# Patient Record
Sex: Male | Born: 1985 | Race: White | Hispanic: No | Marital: Single | Smoking: Former smoker
Health system: Southern US, Community
[De-identification: ages and names within clinical notes are randomized; demographics above are authoritative.]

## PROBLEM LIST (undated history)

## (undated) DIAGNOSIS — S27322A Contusion of lung, bilateral, initial encounter: Secondary | ICD-10-CM

## (undated) DIAGNOSIS — F419 Anxiety disorder, unspecified: Secondary | ICD-10-CM

## (undated) DIAGNOSIS — E119 Type 2 diabetes mellitus without complications: Secondary | ICD-10-CM

## (undated) DIAGNOSIS — I10 Essential (primary) hypertension: Secondary | ICD-10-CM

## (undated) DIAGNOSIS — F32A Depression, unspecified: Secondary | ICD-10-CM

## (undated) DIAGNOSIS — F329 Major depressive disorder, single episode, unspecified: Secondary | ICD-10-CM

## (undated) HISTORY — PX: NO PAST SURGERIES: SHX2092

---

## 2004-03-12 ENCOUNTER — Emergency Department: Payer: Self-pay | Admitting: Emergency Medicine

## 2005-05-13 ENCOUNTER — Emergency Department: Payer: Self-pay | Admitting: Emergency Medicine

## 2006-01-30 ENCOUNTER — Emergency Department: Payer: Self-pay | Admitting: Emergency Medicine

## 2006-11-30 ENCOUNTER — Emergency Department: Payer: Self-pay | Admitting: Emergency Medicine

## 2007-01-08 ENCOUNTER — Emergency Department: Payer: Self-pay | Admitting: Emergency Medicine

## 2007-11-02 ENCOUNTER — Emergency Department: Payer: Self-pay | Admitting: Emergency Medicine

## 2008-04-26 ENCOUNTER — Emergency Department: Payer: Self-pay | Admitting: Emergency Medicine

## 2008-09-25 ENCOUNTER — Emergency Department: Payer: Self-pay | Admitting: Internal Medicine

## 2009-06-09 ENCOUNTER — Emergency Department: Payer: Self-pay | Admitting: Emergency Medicine

## 2009-07-25 ENCOUNTER — Emergency Department: Payer: Self-pay | Admitting: Emergency Medicine

## 2010-09-16 ENCOUNTER — Emergency Department: Payer: Self-pay | Admitting: Emergency Medicine

## 2010-12-24 ENCOUNTER — Emergency Department: Payer: Self-pay | Admitting: Emergency Medicine

## 2011-04-30 ENCOUNTER — Emergency Department: Payer: Self-pay | Admitting: Emergency Medicine

## 2011-05-04 ENCOUNTER — Emergency Department: Payer: Self-pay | Admitting: Emergency Medicine

## 2011-06-30 ENCOUNTER — Emergency Department: Payer: Self-pay | Admitting: Emergency Medicine

## 2011-10-27 ENCOUNTER — Emergency Department: Payer: Self-pay | Admitting: Emergency Medicine

## 2012-07-21 ENCOUNTER — Emergency Department: Payer: Self-pay | Admitting: Emergency Medicine

## 2012-07-21 LAB — CBC
HCT: 44.5 % (ref 40.0–52.0)
HGB: 15.5 g/dL (ref 13.0–18.0)
MCH: 29.3 pg (ref 26.0–34.0)
MCHC: 34.8 g/dL (ref 32.0–36.0)
MCV: 84 fL (ref 80–100)
Platelet: 196 10*3/uL (ref 150–440)
RBC: 5.29 10*6/uL (ref 4.40–5.90)
WBC: 6.1 10*3/uL (ref 3.8–10.6)

## 2013-03-15 ENCOUNTER — Emergency Department: Payer: Self-pay | Admitting: Emergency Medicine

## 2013-03-17 ENCOUNTER — Emergency Department (HOSPITAL_COMMUNITY)
Admission: EM | Admit: 2013-03-17 | Discharge: 2013-03-17 | Disposition: A | Discharge status: Home / Self Care | Payer: Self-pay | Attending: Emergency Medicine | Admitting: Emergency Medicine

## 2013-03-17 ENCOUNTER — Encounter (HOSPITAL_COMMUNITY): Payer: Self-pay | Admitting: Emergency Medicine

## 2013-03-17 DIAGNOSIS — B029 Zoster without complications: Secondary | ICD-10-CM | POA: Insufficient documentation

## 2013-03-17 DIAGNOSIS — Z79899 Other long term (current) drug therapy: Secondary | ICD-10-CM | POA: Insufficient documentation

## 2013-03-17 DIAGNOSIS — J069 Acute upper respiratory infection, unspecified: Secondary | ICD-10-CM | POA: Insufficient documentation

## 2013-03-17 MED ORDER — OXYCODONE-ACETAMINOPHEN 5-325 MG PO TABS: 1.0000 | ORAL_TABLET | Freq: Four times a day (QID) | ORAL | Status: DC | PRN

## 2013-03-17 MED ORDER — ACYCLOVIR 200 MG PO CAPS: 800.0000 mg | ORAL_CAPSULE | Freq: Every day | ORAL | Status: DC

## 2013-03-17 MED ORDER — OXYCODONE-ACETAMINOPHEN 5-325 MG PO TABS
1.0000 | ORAL_TABLET | Freq: Once | ORAL | Status: AC
  Administered 2013-03-17: 1 via ORAL
  Filled 2013-03-17: qty 1

## 2013-03-17 MED ORDER — ACYCLOVIR 200 MG PO CAPS: 800.0000 mg | ORAL_CAPSULE | Freq: Once | ORAL | Status: DC

## 2013-03-17 MED ORDER — ONDANSETRON HCL 4 MG PO TABS
4.0000 mg | ORAL_TABLET | Freq: Once | ORAL | Status: AC
  Administered 2013-03-17: 4 mg via ORAL
  Filled 2013-03-17: qty 1

## 2013-03-17 MED ORDER — ACYCLOVIR 800 MG PO TABS
800.0000 mg | ORAL_TABLET | Freq: Once | ORAL | Status: AC
  Administered 2013-03-17: 800 mg via ORAL

## 2013-03-17 MED ORDER — ACYCLOVIR 800 MG PO TABS
ORAL_TABLET | ORAL | Status: AC
  Filled 2013-03-17: qty 1

## 2013-03-17 NOTE — ED Provider Notes (Signed)
CSN: [id number]     Arrival date & time 03/17/13  2013 History   First MD Initiated Contact with Patient 03/17/13 2036     Chief Complaint  Patient presents with  . Herpes Zoster   (Consider location/radiation/quality/duration/timing/severity/associated sxs/prior Treatment) HPI Comments: The patient presents to the emergency department with complaint of painful rash on the right upper shoulder and chest. Patient states that approximately a week ago he began having cold-type symptoms with chills and aches, body aches fever and nasal congestion. The patient states that he was seen at the [facility] emergency department and was diagnosed with" poison oak". The patient continued to have pain even with his close to the rash and generally not feeling well and presents now to the emergency department for evaluation in another opinion. The patient has no rash in any other areas. He has not measured any temperature elevation. He's not had any rash in his hands or feet. It is of note that he is around four  other children at home.  The history is provided by the patient.    History reviewed. No pertinent past medical history. History reviewed. No pertinent past surgical history. History reviewed. No pertinent family history. History  Substance Use Topics  . Smoking status: Never Smoker   . Smokeless tobacco: Not on file  . Alcohol Use: No    Review of Systems  Constitutional: Negative for activity change.       All ROS Neg except as noted in HPI  HENT: Negative for nosebleeds.   Eyes: Negative for photophobia and discharge.  Respiratory: Negative for cough, shortness of breath and wheezing.   Cardiovascular: Negative for chest pain and palpitations.  Gastrointestinal: Negative for abdominal pain and blood in stool.  Genitourinary: Negative for dysuria, frequency and hematuria.  Musculoskeletal: Negative for arthralgias, back pain and neck pain.  Skin: Positive for rash.  Neurological: Negative  for dizziness, seizures and speech difficulty.  Psychiatric/Behavioral: Negative for hallucinations and confusion.    Allergies  Review of patient's allergies indicates no known allergies.  Home Medications   Current Outpatient Rx  Name  Route  Sig  Dispense  Refill  . acyclovir (ZOVIRAX) 200 MG capsule   Oral   Take 4 capsules (800 mg total) by mouth 5 (five) times daily.   140 capsule   0   . oxyCODONE-acetaminophen (PERCOCET/ROXICET) 5-325 MG per tablet   Oral   Take 1 tablet by mouth every 6 (six) hours as needed for severe pain.   20 tablet   0    BP 155/88  Pulse 70  Temp(Src) 98.1 F (36.7 C) (Oral)  Resp 18  Ht 5\' 6"  (1.676 m)  Wt 240 lb (108.863 kg)  BMI 38.76 kg/m2  SpO2 98% Physical Exam  Nursing note and vitals reviewed. Constitutional: He is oriented to person, place, and time. He appears well-developed and well-nourished.  Non-toxic appearance.  HENT:  Head: Normocephalic.  Right Ear: Tympanic membrane and external ear normal.  Left Ear: Tympanic membrane and external ear normal.  Eyes: EOM and lids are normal. Pupils are equal, round, and reactive to light.  Neck: Normal range of motion. Neck supple. Carotid bruit is not present.  Cardiovascular: Normal rate, regular rhythm, normal heart sounds, intact distal pulses and normal pulses.   Pulmonary/Chest: Breath sounds normal. No respiratory distress.  Abdominal: Soft. Bowel sounds are normal. There is no tenderness. There is no guarding.  Musculoskeletal: Normal range of motion.  Lymphadenopathy:  Head (right side): No submandibular adenopathy present.       Head (left side): No submandibular adenopathy present.    He has no cervical adenopathy.  Neurological: He is alert and oriented to person, place, and time. He has normal strength. No cranial nerve deficit or sensory deficit.  Skin: Skin is warm and dry.  There are 2-3 patches of raised red tender rash of the right upper shoulder just above  the scapula. There is pain to palpation at the top of the shoulder on the right side. There are 2 patches of red raised tender rash on the anterior upper chest. No drainage. No red streaking noted.  Psychiatric: He has a normal mood and affect. His speech is normal.    ED Course  Procedures (including critical care time) Labs Review Labs Reviewed - No data to display Imaging Review No results found.  EKG Interpretation   None       MDM   1. Herpes zoster   2. URI (upper respiratory infection)    **I have reviewed nursing notes, vital signs, and all appropriate lab and imaging results for this patient.*  Examination tonight is consistent with herpes zoster. Patient is treated with acyclovir 800 mg 5 times daily and Percocet one every 6 hours as needed for pain. Patient is advised to warn those around him of his herpes zoster/shingles. He is also advised to return to the emergency department or see his primary physician if not improving.  Kathie Dike, PA-C 03/17/13 2133

## 2013-03-17 NOTE — ED Notes (Signed)
Rash to rt scapula and rt upper chest, Seen at [facility] yesterday and dx with  Poison oak  .  Face flushed./red.

## 2013-03-19 NOTE — ED Provider Notes (Signed)
Medical screening examination/treatment/procedure(s) were performed by non-physician practitioner and as supervising physician I was immediately available for consultation/collaboration.   Nelia Shi, MD 03/19/13 [phone]

## 2013-06-07 ENCOUNTER — Emergency Department (HOSPITAL_COMMUNITY)
Admission: EM | Admit: 2013-06-07 | Discharge: 2013-06-07 | Disposition: A | Discharge status: Home / Self Care | Payer: Self-pay | Attending: Emergency Medicine | Admitting: Emergency Medicine

## 2013-06-07 ENCOUNTER — Encounter (HOSPITAL_COMMUNITY): Payer: Self-pay | Admitting: Emergency Medicine

## 2013-06-07 DIAGNOSIS — Z79899 Other long term (current) drug therapy: Secondary | ICD-10-CM | POA: Insufficient documentation

## 2013-06-07 DIAGNOSIS — R519 Headache, unspecified: Secondary | ICD-10-CM

## 2013-06-07 DIAGNOSIS — R112 Nausea with vomiting, unspecified: Secondary | ICD-10-CM | POA: Insufficient documentation

## 2013-06-07 DIAGNOSIS — R509 Fever, unspecified: Secondary | ICD-10-CM

## 2013-06-07 DIAGNOSIS — R630 Anorexia: Secondary | ICD-10-CM | POA: Insufficient documentation

## 2013-06-07 DIAGNOSIS — R1110 Vomiting, unspecified: Secondary | ICD-10-CM

## 2013-06-07 DIAGNOSIS — R51 Headache: Secondary | ICD-10-CM | POA: Insufficient documentation

## 2013-06-07 DIAGNOSIS — R197 Diarrhea, unspecified: Secondary | ICD-10-CM

## 2013-06-07 LAB — CBC WITH DIFFERENTIAL/PLATELET
BASOS ABS: 0 10*3/uL (ref 0.0–0.1)
BASOS PCT: 0 % (ref 0–1)
Eosinophils Absolute: 0 10*3/uL (ref 0.0–0.7)
Eosinophils Relative: 0 % (ref 0–5)
HEMATOCRIT: 42.8 % (ref 39.0–52.0)
HEMOGLOBIN: 14.8 g/dL (ref 13.0–17.0)
Lymphocytes Relative: 11 % — ABNORMAL LOW (ref 12–46)
Lymphs Abs: 0.7 10*3/uL (ref 0.7–4.0)
MCH: 29.1 pg (ref 26.0–34.0)
MCHC: 34.6 g/dL (ref 30.0–36.0)
MCV: 84.1 fL (ref 78.0–100.0)
MONOS PCT: 15 % — AB (ref 3–12)
Monocytes Absolute: 0.9 10*3/uL (ref 0.1–1.0)
NEUTROS ABS: 4.3 10*3/uL (ref 1.7–7.7)
Neutrophils Relative %: 74 % (ref 43–77)
Platelets: 142 10*3/uL — ABNORMAL LOW (ref 150–400)
RBC: 5.09 MIL/uL (ref 4.22–5.81)
RDW: 12.5 % (ref 11.5–15.5)
WBC: 5.8 10*3/uL (ref 4.0–10.5)

## 2013-06-07 LAB — URINALYSIS, ROUTINE W REFLEX MICROSCOPIC
BILIRUBIN URINE: NEGATIVE
GLUCOSE, UA: 100 mg/dL — AB
Hgb urine dipstick: NEGATIVE
KETONES UR: NEGATIVE mg/dL
Leukocytes, UA: NEGATIVE
Nitrite: NEGATIVE
PH: 5.5 (ref 5.0–8.0)
Protein, ur: 100 mg/dL — AB
Specific Gravity, Urine: >1.03 — ABNORMAL HIGH (ref 1.005–1.030)
Urobilinogen, UA: 0.2 mg/dL (ref 0.0–1.0)

## 2013-06-07 LAB — URINE MICROSCOPIC-ADD ON

## 2013-06-07 LAB — COMPREHENSIVE METABOLIC PANEL
ALBUMIN: 3.9 g/dL (ref 3.5–5.2)
ALK PHOS: 59 U/L (ref 39–117)
ALT: 36 U/L (ref 0–53)
AST: 28 U/L (ref 0–37)
BILIRUBIN TOTAL: 0.4 mg/dL (ref 0.3–1.2)
BUN: 12 mg/dL (ref 6–23)
CHLORIDE: 95 meq/L — AB (ref 96–112)
CO2: 27 mEq/L (ref 19–32)
Calcium: 9.1 mg/dL (ref 8.4–10.5)
Creatinine, Ser: 1.21 mg/dL (ref 0.50–1.35)
GFR calc Af Amer: >90 mL/min (ref >90)
GFR, EST NON AFRICAN AMERICAN: 80 mL/min — AB (ref >90)
Glucose, Bld: 164 mg/dL — ABNORMAL HIGH (ref 70–99)
POTASSIUM: 4 meq/L (ref 3.7–5.3)
Sodium: 136 mEq/L — ABNORMAL LOW (ref 137–147)
Total Protein: 7.9 g/dL (ref 6.0–8.3)

## 2013-06-07 LAB — LIPASE, BLOOD: Lipase: 26 U/L (ref 11–59)

## 2013-06-07 MED ORDER — DIPHENOXYLATE-ATROPINE 2.5-0.025 MG PO TABS
2.0000 | ORAL_TABLET | Freq: Once | ORAL | Status: AC
  Administered 2013-06-07: 2 via ORAL
  Filled 2013-06-07: qty 2

## 2013-06-07 MED ORDER — DIPHENOXYLATE-ATROPINE 2.5-0.025 MG PO TABS: 2.0000 | ORAL_TABLET | Freq: Four times a day (QID) | ORAL | Status: DC | PRN

## 2013-06-07 MED ORDER — ONDANSETRON HCL 4 MG/2ML IJ SOLN
4.0000 mg | Freq: Once | INTRAMUSCULAR | Status: AC
  Administered 2013-06-07: 4 mg via INTRAVENOUS
  Filled 2013-06-07: qty 2

## 2013-06-07 MED ORDER — KETOROLAC TROMETHAMINE 30 MG/ML IJ SOLN
30.0000 mg | Freq: Once | INTRAMUSCULAR | Status: AC
Start: 2013-06-07 — End: 2013-06-07
  Administered 2013-06-07: 30 mg via INTRAVENOUS
  Filled 2013-06-07: qty 1

## 2013-06-07 MED ORDER — PROMETHAZINE HCL 25 MG PO TABS: 25.0000 mg | ORAL_TABLET | Freq: Four times a day (QID) | ORAL | Status: DC | PRN

## 2013-06-07 MED ORDER — ACETAMINOPHEN 500 MG PO TABS
1000.0000 mg | ORAL_TABLET | Freq: Once | ORAL | Status: DC
  Filled 2013-06-07: qty 2

## 2013-06-07 MED ORDER — SODIUM CHLORIDE 0.9 % IV BOLUS (SEPSIS)
1000.0000 mL | Freq: Once | INTRAVENOUS | Status: AC
  Administered 2013-06-07: 1000 mL via INTRAVENOUS

## 2013-06-07 NOTE — ED Provider Notes (Signed)
CSN: [id number]     Arrival date & time 06/07/13  1931 History   First MD Initiated Contact with Patient 06/07/13 2016     Chief Complaint  Patient presents with  . Fever     (Consider location/radiation/quality/duration/timing/severity/associated sxs/prior Treatment) HPI Comments: Patient presents to the ER for evaluation of fever, chills, headache, nausea, vomiting, diarrhea. Symptoms began yesterday. Patient reports that he has been unable to hold down anything the course of today. No vomiting blood. He has not had any blood in his stools. Patient denies abdominal pain. No neck pain or stiffness.  Patient is a 28 y.o. male presenting with fever.  Fever Associated symptoms: diarrhea, headaches, nausea and vomiting     History reviewed. No pertinent past medical history. History reviewed. No pertinent past surgical history. History reviewed. No pertinent family history. History  Substance Use Topics  . Smoking status: Never Smoker   . Smokeless tobacco: Not on file  . Alcohol Use: No    Review of Systems  Constitutional: Positive for fever.  Gastrointestinal: Positive for nausea, vomiting and diarrhea.  Neurological: Positive for headaches.  All other systems reviewed and are negative.      Allergies  Review of patient's allergies indicates no known allergies.  Home Medications   Current Outpatient Rx  Name  Route  Sig  Dispense  Refill  . acyclovir (ZOVIRAX) 200 MG capsule   Oral   Take 4 capsules (800 mg total) by mouth 5 (five) times daily.   140 capsule   0   . oxyCODONE-acetaminophen (PERCOCET/ROXICET) 5-325 MG per tablet   Oral   Take 1 tablet by mouth every 6 (six) hours as needed for severe pain.   20 tablet   0    BP 117/47  Pulse 97  Temp(Src) 101.3 F (38.5 C) (Oral)  Resp 18  Ht 5\' 5"  (1.651 m)  Wt 240 lb (108.863 kg)  BMI 39.94 kg/m2  SpO2 98% Physical Exam  Constitutional: He is oriented to person, place, and time. He appears  well-developed and well-nourished. No distress.  HENT:  Head: Normocephalic and atraumatic.  Right Ear: Hearing normal.  Left Ear: Hearing normal.  Nose: Nose normal.  Mouth/Throat: Oropharynx is clear and moist and mucous membranes are normal.  Eyes: Conjunctivae and EOM are normal. Pupils are equal, round, and reactive to light.  Neck: Normal range of motion. Neck supple. No rigidity. Normal range of motion present. No Brudzinski's sign and no Kernig's sign noted.  Cardiovascular: Regular rhythm, S1 normal and S2 normal.  Exam reveals no gallop and no friction rub.   No murmur heard. Pulmonary/Chest: Effort normal and breath sounds normal. No respiratory distress. He exhibits no tenderness.  Abdominal: Soft. Normal appearance and bowel sounds are normal. There is no hepatosplenomegaly. There is no tenderness. There is no rebound, no guarding, no tenderness at McBurney's point and negative Murphy's sign. No hernia.  Musculoskeletal: Normal range of motion.  Neurological: He is alert and oriented to person, place, and time. He has normal strength. No cranial nerve deficit or sensory deficit. Coordination normal. GCS eye subscore is 4. GCS verbal subscore is 5. GCS motor subscore is 6.  Skin: Skin is warm, dry and intact. No rash noted. No cyanosis.  Psychiatric: He has a normal mood and affect. His speech is normal and behavior is normal. Thought content normal.    ED Course  Procedures (including critical care time) Labs Review Labs Reviewed  CBC WITH DIFFERENTIAL  COMPREHENSIVE METABOLIC PANEL  LIPASE, BLOOD  URINALYSIS, ROUTINE W REFLEX MICROSCOPIC   Imaging Review No results found.   EKG Interpretation None      MDM   Final diagnoses:  None   Patient presents to the ER for evaluation of flulike symptoms. Patient has been running a fever with nausea, vomiting and diarrhea. He is not experiencing abdominal pain and has a benign, nontender abdominal exam. Patient complains  of headache, which is felt to be secondary to viral syndrome. There is no meningismus, patient does not have any symptoms or signs that would support meningitis. The patient's blood work is reassuringly normal other than slightly elevated glucose at 164. This will need to be rechecked in the future, but is not diagnostic.  Patient was febrile on arrival. This was treated with Tylenol at home. Patient additionally given Toradol for his headache and generalized body aches. She was given IV fluids and Zofran for nausea and vomiting. Administer Lomotil for diarrhea. Patient has had improvement. Will be discharged with continued symptomatic care, followup with primary care.   Gilda Creasehristopher J. Pollina, MD 06/07/13 2120

## 2013-06-07 NOTE — Discharge Instructions (Signed)
Your blood sugar was slightly elevated today. This was likely secondary to vomiting and acute illness, but will need to be rechecked after you are improved to make sure that it is not running consistently high which could mean diabetes. You have been provided a resource list to find a primary care provider.  Diarrhea Diarrhea is frequent loose and watery bowel movements. It can cause you to feel weak and dehydrated. Dehydration can cause you to become tired and thirsty, have a dry mouth, and have decreased urination that often is dark yellow. Diarrhea is a sign of another problem, most often an infection that will not last long. In most cases, diarrhea typically lasts 2 3 days. However, it can last longer if it is a sign of something more serious. It is important to treat your diarrhea as directed by your caregive to lessen or prevent future episodes of diarrhea. CAUSES  Some common causes include:  Gastrointestinal infections caused by viruses, bacteria, or parasites.  Food poisoning or food allergies.  Certain medicines, such as antibiotics, chemotherapy, and laxatives.  Artificial sweeteners and fructose.  Digestive disorders. HOME CARE INSTRUCTIONS  Ensure adequate fluid intake (hydration): have 1 cup (8 oz) of fluid for each diarrhea episode. Avoid fluids that contain simple sugars or sports drinks, fruit juices, whole milk products, and sodas. Your urine should be clear or pale yellow if you are drinking enough fluids. Hydrate with an oral rehydration solution that you can purchase at pharmacies, retail stores, and online. You can prepare an oral rehydration solution at home by mixing the following ingredients together:    tsp table salt.   tsp baking soda.   tsp salt substitute containing potassium chloride.  1  tablespoons sugar.  1 L (34 oz) of water.  Certain foods and beverages may increase the speed at which food moves through the gastrointestinal (GI) tract. These foods  and beverages should be avoided and include:  Caffeinated and alcoholic beverages.  High-fiber foods, such as raw fruits and vegetables, nuts, seeds, and whole grain breads and cereals.  Foods and beverages sweetened with sugar alcohols, such as xylitol, sorbitol, and mannitol.  Some foods may be well tolerated and may help thicken stool including:  Starchy foods, such as rice, toast, pasta, low-sugar cereal, oatmeal, grits, baked potatoes, crackers, and bagels.  Bananas.  Applesauce.  Add probiotic-rich foods to help increase healthy bacteria in the GI tract, such as yogurt and fermented milk products.  Wash your hands well after each diarrhea episode.  Only take over-the-counter or prescription medicines as directed by your caregiver.  Take a warm bath to relieve any burning or pain from frequent diarrhea episodes. SEEK IMMEDIATE MEDICAL CARE IF:   You are unable to keep fluids down.  You have persistent vomiting.  You have blood in your stool, or your stools are black and tarry.  You do not urinate in 6 8 hours, or there is only a small amount of very dark urine.  You have abdominal pain that increases or localizes.  You have weakness, dizziness, confusion, or lightheadedness.  You have a severe headache.  Your diarrhea gets worse or does not get better.  You have a fever or persistent symptoms for more than 2 3 days.  You have a fever and your symptoms suddenly get worse. MAKE SURE YOU:   Understand these instructions.  Will watch your condition.  Will get help right away if you are not doing well or get worse. Document Released: 03/07/2002 Document  Revised: 03/03/2012 Document Reviewed: 11/23/2011 [facility] Patient Information 2014 [place], [place].  Fever, Adult A fever is a higher than normal body temperature. In an adult, an oral temperature around 98.6 F (37 C) is considered normal. A temperature of 100.4 F (38 C) or higher is generally considered a  fever. Mild or moderate fevers generally have no long-term effects and often do not require treatment. Extreme fever (greater than or equal to 106 F or 41.1 C) can cause seizures. The sweating that may occur with repeated or prolonged fever may cause dehydration. Elderly people can develop confusion during a fever. A measured temperature can vary with:  Age.  Time of day.  Method of measurement (mouth, underarm, rectal, or ear). The fever is confirmed by taking a temperature with a thermometer. Temperatures can be taken different ways. Some methods are accurate and some are not.  An oral temperature is used most commonly. Electronic thermometers are fast and accurate.  An ear temperature will only be accurate if the thermometer is positioned as recommended by the manufacturer.  A rectal temperature is accurate and done for those adults who have a condition where an oral temperature cannot be taken.  An underarm (axillary) temperature is not accurate and not recommended. Fever is a symptom, not a disease.  CAUSES   Infections commonly cause fever.  Some noninfectious causes for fever include:  Some arthritis conditions.  Some thyroid or adrenal gland conditions.  Some immune system conditions.  Some types of cancer.  A medicine reaction.  High doses of certain street drugs such as methamphetamine.  Dehydration.  Exposure to high outside or room temperatures.  Occasionally, the source of a fever cannot be determined. This is sometimes called a "fever of unknown origin" (FUO).  Some situations may lead to a temporary rise in body temperature that may go away on its own. Examples are:  Childbirth.  Surgery.  Intense exercise. HOME CARE INSTRUCTIONS   Take appropriate medicines for fever. Follow dosing instructions carefully. If you use acetaminophen to reduce the fever, be careful to avoid taking other medicines that also contain acetaminophen. Do not take aspirin for  a fever if you are younger than age 56. There is an association with Reye's syndrome. Reye's syndrome is a rare but potentially deadly disease.  If an infection is present and antibiotics have been prescribed, take them as directed. Finish them even if you start to feel better.  Rest as needed.  Maintain an adequate fluid intake. To prevent dehydration during an illness with prolonged or recurrent fever, you may need to drink extra fluid.Drink enough fluids to keep your urine clear or pale yellow.  Sponging or bathing with room temperature water may help reduce body temperature. Do not use ice water or alcohol sponge baths.  Dress comfortably, but do not over-bundle. SEEK MEDICAL CARE IF:   You are unable to keep fluids down.  You develop vomiting or diarrhea.  You are not feeling at least partly better after 3 days.  You develop new symptoms or problems. SEEK IMMEDIATE MEDICAL CARE IF:   You have shortness of breath or trouble breathing.  You develop excessive weakness.  You are dizzy or you faint.  You are extremely thirsty or you are making little or no urine.  You develop new pain that was not there before (such as in the head, neck, chest, back, or abdomen).  You have persistant vomiting and diarrhea for more than 1 to 2 days.  You develop a  stiff neck or your eyes become sensitive to light.  You develop a skin rash.  You have a fever or persistent symptoms for more than 2 to 3 days.  You have a fever and your symptoms suddenly get worse. MAKE SURE YOU:   Understand these instructions.  Will watch your condition.  Will get help right away if you are not doing well or get worse. Document Released: 09/10/2000 Document Revised: 06/09/2011 Document Reviewed: 01/16/2011 [facility] Patient Information 2014 [place], [place].  Nausea and Vomiting Nausea is a sick feeling that often comes before throwing up (vomiting). Vomiting is a reflex where stomach contents come out  of your mouth. Vomiting can cause severe loss of body fluids (dehydration). Children and elderly adults can become dehydrated quickly, especially if they also have diarrhea. Nausea and vomiting are symptoms of a condition or disease. It is important to find the cause of your symptoms. CAUSES   Direct irritation of the stomach lining. This irritation can result from increased acid production (gastroesophageal reflux disease), infection, food poisoning, taking certain medicines (such as nonsteroidal anti-inflammatory drugs), alcohol use, or tobacco use.  Signals from the brain.These signals could be caused by a headache, heat exposure, an inner ear disturbance, increased pressure in the brain from injury, infection, a tumor, or a concussion, pain, emotional stimulus, or metabolic problems.  An obstruction in the gastrointestinal tract (bowel obstruction).  Illnesses such as diabetes, hepatitis, gallbladder problems, appendicitis, kidney problems, cancer, sepsis, atypical symptoms of a heart attack, or eating disorders.  Medical treatments such as chemotherapy and radiation.  Receiving medicine that makes you sleep (general anesthetic) during surgery. DIAGNOSIS Your caregiver may ask for tests to be done if the problems do not improve after a few days. Tests may also be done if symptoms are severe or if the reason for the nausea and vomiting is not clear. Tests may include:  Urine tests.  Blood tests.  Stool tests.  Cultures (to look for evidence of infection).  X-rays or other imaging studies. Test results can help your caregiver make decisions about treatment or the need for additional tests. TREATMENT You need to stay well hydrated. Drink frequently but in small amounts.You may wish to drink water, sports drinks, clear broth, or eat frozen ice pops or gelatin dessert to help stay hydrated.When you eat, eating slowly may help prevent nausea.There are also some antinausea medicines that  may help prevent nausea. HOME CARE INSTRUCTIONS   Take all medicine as directed by your caregiver.  If you do not have an appetite, do not force yourself to eat. However, you must continue to drink fluids.  If you have an appetite, eat a normal diet unless your caregiver tells you differently.  Eat a variety of complex carbohydrates (rice, wheat, potatoes, bread), lean meats, yogurt, fruits, and vegetables.  Avoid high-fat foods because they are more difficult to digest.  Drink enough water and fluids to keep your urine clear or pale yellow.  If you are dehydrated, ask your caregiver for specific rehydration instructions. Signs of dehydration may include:  Severe thirst.  Dry lips and mouth.  Dizziness.  Dark urine.  Decreasing urine frequency and amount.  Confusion.  Rapid breathing or pulse. SEEK IMMEDIATE MEDICAL CARE IF:   You have blood or brown flecks (like coffee grounds) in your vomit.  You have black or bloody stools.  You have a severe headache or stiff neck.  You are confused.  You have severe abdominal pain.  You have chest pain  or trouble breathing.  You do not urinate at least once every 8 hours.  You develop cold or clammy skin.  You continue to vomit for longer than 24 to 48 hours.  You have a fever. MAKE SURE YOU:   Understand these instructions.  Will watch your condition.  Will get help right away if you are not doing well or get worse. Document Released: 03/17/2005 Document Revised: 06/09/2011 Document Reviewed: 08/14/2010 [facility] Patient Information 2014 [place], [place].  Viral Infections A viral infection can be caused by different types of viruses.Most viral infections are not serious and resolve on their own. However, some infections may cause severe symptoms and may lead to further complications. SYMPTOMS Viruses can frequently cause:  Minor sore throat.  Aches and pains.  Headaches.  Runny nose.  Different types of  rashes.  Watery eyes.  Tiredness.  Cough.  Loss of appetite.  Gastrointestinal infections, resulting in nausea, vomiting, and diarrhea. These symptoms do not respond to antibiotics because the infection is not caused by bacteria. However, you might catch a bacterial infection following the viral infection. This is sometimes called a "superinfection." Symptoms of such a bacterial infection may include:  Worsening sore throat with pus and difficulty swallowing.  Swollen neck glands.  Chills and a high or persistent fever.  Severe headache.  Tenderness over the sinuses.  Persistent overall ill feeling (malaise), muscle aches, and tiredness (fatigue).  Persistent cough.  Yellow, green, or brown mucus production with coughing. HOME CARE INSTRUCTIONS   Only take over-the-counter or prescription medicines for pain, discomfort, diarrhea, or fever as directed by your caregiver.  Drink enough water and fluids to keep your urine clear or pale yellow. Sports drinks can provide valuable electrolytes, sugars, and hydration.  Get plenty of rest and maintain proper nutrition. Soups and broths with crackers or rice are fine. SEEK IMMEDIATE MEDICAL CARE IF:   You have severe headaches, shortness of breath, chest pain, neck pain, or an unusual rash.  You have uncontrolled vomiting, diarrhea, or you are unable to keep down fluids.  You or your child has an oral temperature above 102 F (38.9 C), not controlled by medicine.  Your baby is older than 3 months with a rectal temperature of 102 F (38.9 C) or higher.  Your baby is 43 months old or younger with a rectal temperature of 100.4 F (38 C) or higher. MAKE SURE YOU:   Understand these instructions.  Will watch your condition.  Will get help right away if you are not doing well or get worse. Document Released: 12/25/2004 Document Revised: 06/09/2011 Document Reviewed: 07/22/2010 [facility] Patient Information 2014 [place],  [place].   Emergency Department Resource Guide 1) Find a Doctor and Pay Out of Pocket Although you won't have to find out who is covered by your insurance plan, it is a good idea to ask around and get recommendations. You will then need to call the office and see if the doctor you have chosen will accept you as a new patient and what types of options they offer for patients who are self-pay. Some doctors offer discounts or will set up payment plans for their patients who do not have insurance, but you will need to ask so you aren't surprised when you get to your appointment.  2) Contact Your Local Health Department Not all health departments have doctors that can see patients for sick visits, but many do, so it is worth a call to see if yours does. If you don't  know where your local health department is, you can check in your phone book. The CDC also has a tool to help you locate your state's health department, and many state websites also have listings of all of their local health departments.  3) Find a Walk-in Clinic If your illness is not likely to be very severe or complicated, you may want to try a walk in clinic. These are popping up all over the country in pharmacies, drugstores, and shopping centers. They're usually staffed by nurse practitioners or physician assistants that have been trained to treat common illnesses and complaints. They're usually fairly quick and inexpensive. However, if you have serious medical issues or chronic medical problems, these are probably not your best option.  No Primary Care Doctor: - Call Health Connect at  [phone] - they can help you locate a primary care doctor that  accepts your insurance, provides certain services, etc. - Physician Referral Service- [phone]  Chronic Pain Problems: Organization         Address  Phone   Notes  [facility]  [phone] Patients need to be referred by their primary care doctor.   Medication  Assistance: Organization         Address  Phone   Notes  [facility] Medication [facility] [address] [place] [place], [place] [address] [phone] --Must be a resident of [facility] -- Must have NO insurance coverage whatsoever (no Medicaid/ Medicare, etc.) -- The pt. MUST have a primary care doctor that directs their care regularly and follows them in the community   [facility]ist  [phone]   [employer]  [phone]    Agencies that provide inexpensive medical care: Organization         Address  Phone   Notes  [facility]  [phone]   [facility]    [phone]   [facility] [address] [place], [place] [address] [phone]   [facility] [address] [place] [phone]   Planned Parenthood    [phone]   [facility]    [phone]   Community Health and [facility]  [address], [place] Phone:  [phone], Fax:  [phone] Hours of Operation:  9 am - 6 pm, M-F.  Also accepts Medicaid/Medicare and self-pay.  [facility]  [address], [place] Phone: [phone], Fax: [phone]. Hours of Operation:  8:30 am - 5:30 pm, M-F.  Also accepts Medicaid and self-pay.  [facility] [place] [address] [place] Phone: [phone]   [facility] [address] Natasha Bence [place], [place] [phone], Ext. [phone] Mondays & Thursdays: 7-9 AM.  First 15 patients are seen on a first come, first serve basis.    Medicaid-accepting [facility] Providers:  Organization         Address  Phone   Notes  [facility] [address], [place] [phone] Also accepts self-pay patients.  [facility] [address] Laurell Josephs [place], [place]  [phone]   [facility] [address], [place]  [phone]   [facility] [address] [place] [phone]   Renaye Rakers [address], [place]   [phone] Only accepts [place] Access  Medicaid patients after they have their name applied to their card.   Self-Pay (no insurance) in [facility]:  Organization         Address  Phone   Notes  Sickle Cell Patients, [facility] [address] [place], [place] [phone]   [facility] [address] [place], [place] [phone]   [facility] [place]  [address], [place] [phone]   [facility]/Dr. Osei-Bonsu  [address], [place] or [address], [place] [phone] Phone number for both [place] and [place] locations is the same.  [facility] [address], [place] [phone]   [facility] [address], [place] or [address] [phone]   [facility] [address] [place] [phone], phone; [phone], fax Sees patients 1st and 3rd Saturday of every month.  Must not qualify for public or private insurance (i.e. Medicaid, Medicare, [place] Health Choice, Veterans' Benefits)  Household income should be no more than 200% of the poverty level The clinic cannot treat you if you are pregnant or think you are pregnant  Sexually transmitted diseases are not treated at the clinic.    Dental Care: Organization         Address  Phone  Notes  [facility] Department of [facility] [address] [place], [place] [phone] Accepts children up to age 86 who are enrolled in [place] or [place] Health Choice; pregnant women with a Medicaid card; and children who have applied for Medicaid or [place] Health Choice, but were declined, whose parents can pay a reduced fee at time of service.  [facility]  Department of [facility]  [address], [place] [phone] Accepts children up to age 88 who are enrolled in [place] or [place] Health Choice; pregnant women with a Medicaid card; and children who have applied for Medicaid or [place] Health Choice, but were declined, whose parents can pay a reduced fee at time of service.  [facility]  [address] [place], [place] [phone] Patients are seen by appointment only. Walk-ins are not accepted. [facility] will see patients 31 years of age and older. Monday - Tuesday (8am-5pm) Most Wednesdays (8:30-5pm) $30 per visit, cash only  [facility] Access [facility]  [address], [facility] [phone] Patients are seen by appointment only. Walk-ins are not accepted. [facility] will see patients 47 years of age and older. One Wednesday Evening (Monthly: Volunteer Based).  $30 per visit, cash only  [employer]  [phone] for adults; Children under age 31, call Graduate Pediatric Dentistry at [phone]. Children aged 55-14, please call [phone] to request a pediatric application.  Dental services are provided in all areas of dental care including fillings, crowns and bridges, complete and partial dentures, implants, gum treatment, root canals, and extractions. Preventive care is also provided. Treatment is provided to both adults and children. Patients are selected via a lottery and there is often a waiting list.   [facility] [address] [place]  [phone] [email or web address]   [facility] [address] [place], [place] [phone], Ext. [phone] Second and Fourth Thursday of each month, opens at 6:30 AM; Clinic ends at 9 AM.  Patients are seen on a first-come first-served basis, and a limited number are seen during each clinic.   [facility]  [address] Ether Griffins [place], [place] [phone]    Eligibility Requirements You must have lived in [place], [place], or [place] counties for at least the last three months.   You cannot be eligible for state or federal sponsored [place], including [employer], [place], or [employer].   You generally cannot be eligible for healthcare insurance through your employer.    How to apply: Eligibility screenings are held every Tuesday and Wednesday afternoon from 1:00 pm until 4:00 pm. You do not need an appointment for the interview!  [facility] [address] [place], [place] [phone]   [facility] Health Department  [phone]   [facility] Health Department  [phone]   [facility] Department  [phone]    Behavioral Health Resources in the Community: Intensive Outpatient Programs Organization         Address  Phone  Notes  [facility] Services [address] [place], [place] [phone]   [facility] Outpatient [address], [place], [place] [phone]   [facility] [address] [place], [place]  [phone]   [facility] Mental Health [address]  [place], [place] [phone] or [phone]   Substance Abuse Resources Organization         Address  Phone  Notes  [facility]  [phone]   [facility]  [phone]   [facility]  [phone]   Floydene Flock  [phone]   Residential & Outpatient Substance Abuse Program  [phone]   Psychological Services Organization         Address  Phone  Notes  [facility]  [phone]   [facility] Services  [phone]   [facility] Mental Health [address], [place] [phone] or [phone]    Mobile Crisis Teams Organization         Address  Phone  Notes  [facility], Mobile Crisis Care Unit  [phone]   [facility]  [address]  [place], [place] [phone]   Doristine Locks [address] [place] [phone]    Self-Help/Support Groups Organization         Address  Phone             Notes  [facility] [place] - variety of support groups  [phone] Call for more information  Narcotics Anonymous (NA), [facility] [address], [employer] [place]  2 meetings at this location   Statistician         Address  Phone  Notes  [facility] [address],    [place]  [phone]   [facility]  [address], [place] [address], [place], [place] [phone]   [facility] [address] [place], [place] [phone] Admissions: 8am-3pm M-F  [facility] [address],    [place], [place] [phone]   [facility] [address] [place] [place], [place] [phone]   [facility] [address],  [place], [place] [phone]   [facility] Int[facility] [address], [place], [place] [phone]   [facility] ([facility] [address]  [place], [place] [phone] or [phone]   [facility] ([facility] [address] [place], [place] [phone] Accepts Medicaid  [facility] [address]  [place] [phone] Substance Abuse/Addiction Treatment   [facility]         Address  Phone  Notes  [facility]  [phone]) [phone]   Angie FavaJulie Brannon, PhD [address], [address] [place], [place]   [phone]) [phone] or [phone]) [phone]   [facility]   [address] [place], [place] [phone]) [phone]   [facility] [address], [place], [place] [phone]) [phone] Insurance/Medicaid/sponsorship through [facility]  [facility] [address]                                    [place], [place] [phone]) [phone] Therapy/tele-psych/case    [facility] [address]   [place], [place] [phone]) [phone]    Dr. Lolly MustacheArfeen  [phone]) [phone]   [facility]  [facility] 1) [address], [place] 2) [address], [place] 3)  [address], [place] [phone]) [phone]) [phone]  [phone]) [phone]   [facility] [phone]) [phone] or [phone]) [phone] (After Hours)

## 2013-06-07 NOTE — ED Notes (Signed)
Pt reporting improvement in nausea and headache.  P.O fluids and crackers provided.

## 2013-06-07 NOTE — ED Notes (Signed)
PT HAS HAD HEADACHE, FEVER, AND N/V/D SINCE YESTERDAY.

## 2013-06-09 ENCOUNTER — Emergency Department (HOSPITAL_COMMUNITY)
Admission: EM | Admit: 2013-06-09 | Discharge: 2013-06-10 | Disposition: A | Discharge status: Home / Self Care | Payer: Self-pay | Attending: Emergency Medicine | Admitting: Emergency Medicine

## 2013-06-09 ENCOUNTER — Encounter (HOSPITAL_COMMUNITY): Payer: Self-pay | Admitting: Emergency Medicine

## 2013-06-09 ENCOUNTER — Emergency Department (HOSPITAL_COMMUNITY): Payer: Self-pay

## 2013-06-09 DIAGNOSIS — R112 Nausea with vomiting, unspecified: Secondary | ICD-10-CM | POA: Insufficient documentation

## 2013-06-09 DIAGNOSIS — J111 Influenza due to unidentified influenza virus with other respiratory manifestations: Secondary | ICD-10-CM | POA: Insufficient documentation

## 2013-06-09 DIAGNOSIS — R42 Dizziness and giddiness: Secondary | ICD-10-CM | POA: Insufficient documentation

## 2013-06-09 DIAGNOSIS — R079 Chest pain, unspecified: Secondary | ICD-10-CM | POA: Insufficient documentation

## 2013-06-09 DIAGNOSIS — R197 Diarrhea, unspecified: Secondary | ICD-10-CM | POA: Insufficient documentation

## 2013-06-09 DIAGNOSIS — K1370 Unspecified lesions of oral mucosa: Secondary | ICD-10-CM | POA: Insufficient documentation

## 2013-06-09 DIAGNOSIS — E860 Dehydration: Secondary | ICD-10-CM | POA: Insufficient documentation

## 2013-06-09 DIAGNOSIS — R6889 Other general symptoms and signs: Secondary | ICD-10-CM

## 2013-06-09 DIAGNOSIS — Z8619 Personal history of other infectious and parasitic diseases: Secondary | ICD-10-CM | POA: Insufficient documentation

## 2013-06-09 MED ORDER — DIPHENHYDRAMINE HCL 50 MG/ML IJ SOLN
25.0000 mg | Freq: Once | INTRAMUSCULAR | Status: AC
  Administered 2013-06-09: 25 mg via INTRAVENOUS
  Filled 2013-06-09: qty 1

## 2013-06-09 MED ORDER — ACETAMINOPHEN 500 MG PO TABS
1000.0000 mg | ORAL_TABLET | Freq: Once | ORAL | Status: AC
  Administered 2013-06-09: 1000 mg via ORAL
  Filled 2013-06-09: qty 2

## 2013-06-09 MED ORDER — SODIUM CHLORIDE 0.9 % IV BOLUS (SEPSIS)
1000.0000 mL | Freq: Once | INTRAVENOUS | Status: AC
  Administered 2013-06-09: 1000 mL via INTRAVENOUS

## 2013-06-09 MED ORDER — ONDANSETRON 4 MG PO TBDP
4.0000 mg | ORAL_TABLET | Freq: Once | ORAL | Status: AC
  Administered 2013-06-09: 4 mg via ORAL
  Filled 2013-06-09: qty 1

## 2013-06-09 MED ORDER — KETOROLAC TROMETHAMINE 30 MG/ML IJ SOLN
30.0000 mg | Freq: Once | INTRAMUSCULAR | Status: AC
  Administered 2013-06-09: 30 mg via INTRAVENOUS
  Filled 2013-06-09: qty 1

## 2013-06-09 MED ORDER — METOCLOPRAMIDE HCL 5 MG/ML IJ SOLN
10.0000 mg | Freq: Once | INTRAMUSCULAR | Status: AC
  Administered 2013-06-09: 10 mg via INTRAVENOUS
  Filled 2013-06-09: qty 2

## 2013-06-09 NOTE — ED Provider Notes (Signed)
CSN: [id number]     Arrival date & time 06/09/13  2116 History  This chart was scribed for Shon Baton, MD by Bennett Scrape, ED Scribe. This patient was seen in room APA09/APA09 and the patient's care was started at 11:08 PM.   Chief Complaint  Patient presents with  . Fever  . Emesis    The history is provided by the patient. No language interpreter was used.    HPI Comments: Deangelo Berns is a 28 y.o. male who presents to the Emergency Department complaining of 4 days of fever with associated HA, dizziness, CP, nausea, emesis, diarrhea and blisters on the sides of mouth. He also reports abdominal pain that has since resolved. Fever max was 103.5. Temperature has not dropped below 100 since the onset. Temperature in the ED is 102.3. He was seen in the ED 3 days ago for the same and was dx with a virus. He has been alternating Tylenol and IBU since then with mild improvement.  He was given phenergan with no real improvement and he admits to decreased fluid and food intake since his last visit. He denies any sick contacts with similar symptoms. He denies any SOB and sore throat. He denies any chromic medical conditions or being on any daily medications.   History reviewed. No pertinent past medical history. History reviewed. No pertinent past surgical history. History reviewed. No pertinent family history. History  Substance Use Topics  . Smoking status: Never Smoker   . Smokeless tobacco: Not on file  . Alcohol Use: No    Review of Systems  Constitutional: Positive for fever, chills and fatigue.  HENT: Positive for mouth sores. Negative for ear discharge.   Respiratory: Negative for chest tightness.   Cardiovascular: Positive for chest pain.  Gastrointestinal: Positive for nausea, vomiting and diarrhea. Negative for abdominal pain.  Genitourinary: Negative.  Negative for dysuria.  Musculoskeletal: Negative for back pain.  Skin: Negative for rash.  Neurological: Positive for  headaches. Negative for dizziness and weakness.  All other systems reviewed and are negative.      Allergies  Review of patient's allergies indicates no known allergies.  Home Medications   Current Outpatient Rx  Name  Route  Sig  Dispense  Refill  . acetaminophen (TYLENOL) 500 MG tablet   Oral   Take 500 mg by mouth every 6 (six) hours as needed for mild pain or moderate pain.         . promethazine (PHENERGAN) 25 MG tablet   Oral   Take 1 tablet (25 mg total) by mouth every 6 (six) hours as needed for nausea or vomiting.   30 tablet   0   . diphenoxylate-atropine (LOMOTIL) 2.5-0.025 MG per tablet   Oral   Take 2 tablets by mouth 4 (four) times daily as needed for diarrhea or loose stools.   30 tablet   0   . ondansetron (ZOFRAN-ODT) 4 MG disintegrating tablet   Oral   Take 1 tablet (4 mg total) by mouth every 8 (eight) hours as needed for nausea or vomiting.   20 tablet   0    Triage Vitals: BP 125/74  Pulse 89  Temp(Src) 102.3 F (39.1 C) (Oral)  Resp 22  Ht 5\' 6"  (1.676 m)  Wt 240 lb (108.863 kg)  BMI 38.76 kg/m2  SpO2 97%  Physical Exam  Nursing note and vitals reviewed. Constitutional: He is oriented to person, place, and time. No distress.  Ill-appearing but nontoxic, no acute  distress  HENT:  Head: Normocephalic and atraumatic.  Right Ear: External ear normal.  Left Ear: External ear normal.  Mucous membranes dry, small ulcers noted over the oral mucosa, no palatal petechiae, no tonsillar exudate, uvula midline  Eyes: Pupils are equal, round, and reactive to light.  Neck: Normal range of motion. Neck supple.  No meningismus  Cardiovascular: Normal rate, regular rhythm and normal heart sounds.   No murmur heard. Pulmonary/Chest: Effort normal and breath sounds normal. No respiratory distress. He has no wheezes.  Abdominal: Soft. Bowel sounds are normal. There is no tenderness. There is no rebound.  Musculoskeletal: He exhibits no edema.   Lymphadenopathy:    He has no cervical adenopathy.  Neurological: He is alert and oriented to person, place, and time.  Skin: Skin is warm and dry. No rash noted.  Psychiatric: He has a normal mood and affect.    ED Course  Procedures (including critical care time)  DIAGNOSTIC STUDIES: Oxygen Saturation is 97% on RA, adequate by my interpretation.    COORDINATION OF CARE: 11:11 PM-Informed pt that suspicions are that the sxs are from a viral infection which could include the flu; however, the pt is out of the window for any treatment. Discussed treatment plan which includes CXR and IV fluids with pt at bedside and pt agreed to plan.    Labs Review Labs Reviewed  CBC WITH DIFFERENTIAL - Abnormal; Notable for the following:    WBC 2.9 (*)    HCT 38.9 (*)    Platelets 138 (*)    Lymphs Abs 0.5 (*)    Monocytes Relative 15 (*)    All other components within normal limits  COMPREHENSIVE METABOLIC PANEL - Abnormal; Notable for the following:    Sodium 135 (*)    Glucose, Bld 198 (*)    Calcium 8.1 (*)    All other components within normal limits  RAPID STREP SCREEN  CULTURE, GROUP A STREP   Imaging Review Dg Chest 2 View  06/10/2013   CLINICAL DATA:  Weakness, emesis, fever.  EXAM: CHEST  2 VIEW  COMPARISON:  None.  FINDINGS: Mild hypoaeration with interstitial and vascular crowding. No confluent airspace opacity, pleural effusion, or pneumothorax. Cardiomediastinal contours are within normal range for inspiratory effort. No acute osseous finding.  IMPRESSION: Mild hypoaeration. No radiographic evidence of an acute cardiopulmonary process.   Electronically Signed   By: Jearld LeschAndrew  DelGaizo M.D.   On: 06/10/2013 00:50     EKG Interpretation None      MDM   Final diagnoses:  Flu-like symptoms  Dehydration   Patient presents with continued flulike symptoms. Temperature noted to be 1-2.3 in triage. Ill-appearing but nontoxic. Repeat labwork was obtained as well as chest x-ray  and strep screen. Lab work notable for leukopenia with a white count of 2.9. Otherwise, labwork stable. Patient was given normal saline, Reglan, and Benadryl given headache and nausea. No evidence of meningismus and suspect headache is again of viral etiology given constellation of symptoms. Patient reports improvement of symptoms with treatment. I discussed the patient that he may have symptoms for the next few days. He needs to continue supportive care. We'll add Zofran as an anti-emetic. Patient was given strict return precautions.  After history, exam, and medical workup I feel the patient has been appropriately medically screened and is safe for discharge home. Pertinent diagnoses were discussed with the patient. Patient was given return precautions.   I personally performed the services described in this documentation,  which was scribed in my presence. The recorded information has been reviewed and is accurate.      Shon Baton, MD 06/10/13 [phone]

## 2013-06-09 NOTE — ED Notes (Signed)
Pt was seen here on Tuesday with N/V/D. Pt was sent home on phenergan and did not have any more episodes of vomiting until this morning. Hasn't been able to keep anything down. States he feels really weak. Wife states pt has been having fevers and has woken up with bed saturated in sweat.

## 2013-06-09 NOTE — ED Notes (Signed)
Patient complaining of emesis, headache, diffuse abdominal pain, blister in mouth, and fever since Monday.

## 2013-06-10 LAB — CBC WITH DIFFERENTIAL/PLATELET
BASOS PCT: 0 % (ref 0–1)
Basophils Absolute: 0 10*3/uL (ref 0.0–0.1)
Eosinophils Absolute: 0 10*3/uL (ref 0.0–0.7)
Eosinophils Relative: 0 % (ref 0–5)
HCT: 38.9 % — ABNORMAL LOW (ref 39.0–52.0)
HEMOGLOBIN: 13.5 g/dL (ref 13.0–17.0)
LYMPHS ABS: 0.5 10*3/uL — AB (ref 0.7–4.0)
Lymphocytes Relative: 16 % (ref 12–46)
MCH: 28.8 pg (ref 26.0–34.0)
MCHC: 34.7 g/dL (ref 30.0–36.0)
MCV: 83.1 fL (ref 78.0–100.0)
MONOS PCT: 15 % — AB (ref 3–12)
Monocytes Absolute: 0.4 10*3/uL (ref 0.1–1.0)
NEUTROS ABS: 1.9 10*3/uL (ref 1.7–7.7)
Neutrophils Relative %: 69 % (ref 43–77)
Platelets: 138 10*3/uL — ABNORMAL LOW (ref 150–400)
RBC: 4.68 MIL/uL (ref 4.22–5.81)
RDW: 12.3 % (ref 11.5–15.5)
WBC: 2.9 10*3/uL — ABNORMAL LOW (ref 4.0–10.5)

## 2013-06-10 LAB — COMPREHENSIVE METABOLIC PANEL
ALBUMIN: 3.5 g/dL (ref 3.5–5.2)
ALK PHOS: 51 U/L (ref 39–117)
ALT: 38 U/L (ref 0–53)
AST: 35 U/L (ref 0–37)
BILIRUBIN TOTAL: 0.3 mg/dL (ref 0.3–1.2)
BUN: 8 mg/dL (ref 6–23)
CO2: 24 mEq/L (ref 19–32)
Calcium: 8.1 mg/dL — ABNORMAL LOW (ref 8.4–10.5)
Chloride: 98 mEq/L (ref 96–112)
Creatinine, Ser: 0.92 mg/dL (ref 0.50–1.35)
GFR calc Af Amer: >90 mL/min (ref >90)
GFR calc non Af Amer: >90 mL/min (ref >90)
Glucose, Bld: 198 mg/dL — ABNORMAL HIGH (ref 70–99)
POTASSIUM: 3.7 meq/L (ref 3.7–5.3)
Sodium: 135 mEq/L — ABNORMAL LOW (ref 137–147)
Total Protein: 7.1 g/dL (ref 6.0–8.3)

## 2013-06-10 LAB — RAPID STREP SCREEN (MED CTR MEBANE ONLY): STREPTOCOCCUS, GROUP A SCREEN (DIRECT): NEGATIVE

## 2013-06-10 MED ORDER — ONDANSETRON 4 MG PO TBDP: 4.0000 mg | ORAL_TABLET | Freq: Three times a day (TID) | ORAL | Status: DC | PRN

## 2013-06-10 NOTE — Discharge Instructions (Signed)

## 2013-06-13 LAB — CULTURE, GROUP A STREP

## 2013-06-14 ENCOUNTER — Telehealth (HOSPITAL_COMMUNITY): Payer: Self-pay | Admitting: *Deleted

## 2013-06-14 NOTE — Progress Notes (Signed)
ED Antimicrobial Stewardship Positive Culture Follow Up   Peter Buckley is an 28 y.o. male who [facility] with a chief complaint of  Chief Complaint  Patient presents with  . Fever  . Emesis    Recent Results (from the past 720 hour(s))  RAPID STREP SCREEN     Status: None   Collection Time    06/10/13  1:50 AM      Result Value Ref Range Status   Streptococcus, Group A Screen (Direct) NEGATIVE  NEGATIVE Final   Comment: (NOTE)     A Rapid Antigen test may result negative if the antigen level in the     sample is below the detection level of this test. The FDA has not     cleared this test as a stand-alone test therefore the rapid antigen     negative result has reflexed to a Group A Strep culture.  CULTURE, GROUP A STREP     Status: None   Collection Time    06/10/13  1:50 AM      Result Value Ref Range Status   Specimen Description THROAT   Final   Special Requests NONE   Final   Culture     Final   Value: GROUP A STREP (S.PYOGENES) ISOLATED     Performed at [facility]   Report Status 06/13/2013 FINAL   Final    [x]  Patient discharged originally without antimicrobial agent and treatment is now indicated  New antibiotic prescription: amoxicillin 500mg  BID x 10 days  ED Provider: Eber HongBrian Miller, MD   Mickeal SkinnerFrens, Peter Buckley John 06/14/2013, 10:57 AM Infectious Diseases Pharmacist Phone# [phone]

## 2013-06-14 NOTE — ED Notes (Signed)
Post ED Visit - Positive Culture Follow-up: Successful Patient Follow-Up  Culture assessed and recommendations reviewed by: []  Wes Dulaney, Pharm.D., BCPS [x]  Celedonio MiyamotoJeremy Buckley, Pharm.D., BCPS []  Georgina PillionElizabeth Martin, Pharm.D., BCPS []  Big Pine KeyMinh Pham, [address]Pharm.D., BCPS, AAHIVP []  Estella HuskMichelle Turner, Pharm.D., BCPS, AAHIVP  Positive Strep culture  []  Patient discharged without antimicrobial prescription and treatment is now indicated [x]  Organism is resistant to prescribed ED discharge antimicrobial []  Patient with positive blood cultures  Changes discussed with ED provider Peter HongBrian Buckley New antibiotic prescription Plan:Amoxicillin 500 mg BID x 10 days   Larena Soxunnally, Peter Buckley 06/14/2013, 3:53 PM

## 2013-06-17 ENCOUNTER — Encounter (HOSPITAL_COMMUNITY): Payer: Self-pay | Admitting: Emergency Medicine

## 2013-06-17 ENCOUNTER — Emergency Department (HOSPITAL_COMMUNITY)
Admission: EM | Admit: 2013-06-17 | Discharge: 2013-06-17 | Disposition: A | Discharge status: Home / Self Care | Payer: Self-pay | Attending: Emergency Medicine | Admitting: Emergency Medicine

## 2013-06-17 ENCOUNTER — Emergency Department: Payer: Self-pay | Admitting: Emergency Medicine

## 2013-06-17 DIAGNOSIS — J020 Streptococcal pharyngitis: Secondary | ICD-10-CM | POA: Insufficient documentation

## 2013-06-17 DIAGNOSIS — G43909 Migraine, unspecified, not intractable, without status migrainosus: Secondary | ICD-10-CM | POA: Insufficient documentation

## 2013-06-17 LAB — COMPREHENSIVE METABOLIC PANEL
ALBUMIN: 3.4 g/dL — AB (ref 3.5–5.2)
ALK PHOS: 92 U/L (ref 39–117)
ALT: 57 U/L — AB (ref 0–53)
AST: 27 U/L (ref 0–37)
BUN: 10 mg/dL (ref 6–23)
CO2: 27 mEq/L (ref 19–32)
Calcium: 9.3 mg/dL (ref 8.4–10.5)
Chloride: 102 mEq/L (ref 96–112)
Creatinine, Ser: 0.89 mg/dL (ref 0.50–1.35)
GFR calc Af Amer: >90 mL/min (ref >90)
GFR calc non Af Amer: >90 mL/min (ref >90)
GLUCOSE: 237 mg/dL — AB (ref 70–99)
POTASSIUM: 4.5 meq/L (ref 3.7–5.3)
SODIUM: 141 meq/L (ref 137–147)
TOTAL PROTEIN: 6.9 g/dL (ref 6.0–8.3)
Total Bilirubin: 0.4 mg/dL (ref 0.3–1.2)

## 2013-06-17 LAB — CBC WITH DIFFERENTIAL/PLATELET
BASOS PCT: 0 % (ref 0–1)
Basophils Absolute: 0 10*3/uL (ref 0.0–0.1)
Eosinophils Absolute: 0.1 10*3/uL (ref 0.0–0.7)
Eosinophils Relative: 1 % (ref 0–5)
HCT: 40.3 % (ref 39.0–52.0)
Hemoglobin: 13.9 g/dL (ref 13.0–17.0)
LYMPHS ABS: 1.6 10*3/uL (ref 0.7–4.0)
Lymphocytes Relative: 15 % (ref 12–46)
MCH: 28.7 pg (ref 26.0–34.0)
MCHC: 34.5 g/dL (ref 30.0–36.0)
MCV: 83.1 fL (ref 78.0–100.0)
Monocytes Absolute: 0.7 10*3/uL (ref 0.1–1.0)
Monocytes Relative: 7 % (ref 3–12)
Neutro Abs: 8.2 10*3/uL — ABNORMAL HIGH (ref 1.7–7.7)
Neutrophils Relative %: 77 % (ref 43–77)
Platelets: 245 10*3/uL (ref 150–400)
RBC: 4.85 MIL/uL (ref 4.22–5.81)
RDW: 12.4 % (ref 11.5–15.5)
WBC: 10.7 10*3/uL — ABNORMAL HIGH (ref 4.0–10.5)

## 2013-06-17 MED ORDER — SODIUM CHLORIDE 0.9 % IV SOLN
1000.0000 mL | Freq: Once | INTRAVENOUS | Status: AC
  Administered 2013-06-17: 1000 mL via INTRAVENOUS

## 2013-06-17 MED ORDER — OXYCODONE-ACETAMINOPHEN 5-325 MG PO TABS: 1.0000 | ORAL_TABLET | ORAL | Status: DC | PRN

## 2013-06-17 MED ORDER — METOCLOPRAMIDE HCL 5 MG/ML IJ SOLN
10.0000 mg | Freq: Once | INTRAMUSCULAR | Status: AC
  Administered 2013-06-17: 10 mg via INTRAVENOUS
  Filled 2013-06-17: qty 2

## 2013-06-17 MED ORDER — AMOXICILLIN 500 MG PO CAPS: 1000.0000 mg | ORAL_CAPSULE | Freq: Two times a day (BID) | ORAL | Status: DC

## 2013-06-17 MED ORDER — SODIUM CHLORIDE 0.9 % IV SOLN: 1000.0000 mL | Freq: Once | INTRAVENOUS | Status: DC

## 2013-06-17 MED ORDER — KETOROLAC TROMETHAMINE 30 MG/ML IJ SOLN
30.0000 mg | Freq: Once | INTRAMUSCULAR | Status: AC
  Administered 2013-06-17: 30 mg via INTRAVENOUS
  Filled 2013-06-17: qty 1

## 2013-06-17 MED ORDER — DIPHENHYDRAMINE HCL 50 MG/ML IJ SOLN
25.0000 mg | Freq: Once | INTRAMUSCULAR | Status: AC
  Administered 2013-06-17: 04:00:00 via INTRAVENOUS
  Filled 2013-06-17: qty 1

## 2013-06-17 MED ORDER — DEXTROSE 5 % IV SOLN
1.0000 g | Freq: Once | INTRAVENOUS | Status: AC
  Administered 2013-06-17: 1 g via INTRAVENOUS
  Filled 2013-06-17: qty 10

## 2013-06-17 MED ORDER — SODIUM CHLORIDE 0.9 % IV SOLN: 1000.0000 mL | INTRAVENOUS | Status: DC

## 2013-06-17 MED ORDER — METOCLOPRAMIDE HCL 10 MG PO TABS: 10.0000 mg | ORAL_TABLET | Freq: Four times a day (QID) | ORAL | Status: DC | PRN

## 2013-06-17 NOTE — ED Provider Notes (Signed)
CSN: [id number]     Arrival date & time 06/17/13  [id number] History   First MD Initiated Contact with Patient 06/17/13 [phone]     Chief Complaint  Patient presents with  . Headache    headache for 2 weeks. denies vomiting     (Consider location/radiation/quality/duration/timing/severity/associated sxs/prior Treatment) Patient is a 28 y.o. male presenting with headaches. The history is provided by the patient.  Headache He complains of a left hemicranial headache. Headache is described as throbbing and has associated nausea and vomiting. There is photophobia and phonophobia. He rates the pain a 10/10. Nothing makes it any better. He's been having headache off and on since she became ill with fever and vomiting about 10 days ago. Has been in the ED several times for this flulike illness. He denies visual change, weakness, numbness, tingling.  History reviewed. No pertinent past medical history. History reviewed. No pertinent past surgical history. History reviewed. No pertinent family history. History  Substance Use Topics  . Smoking status: Never Smoker   . Smokeless tobacco: Not on file  . Alcohol Use: No    Review of Systems  Neurological: Positive for headaches.  All other systems reviewed and are negative.      Allergies  Review of patient's allergies indicates no known allergies.  Home Medications   Current Outpatient Rx  Name  Route  Sig  Dispense  Refill  . promethazine (PHENERGAN) 25 MG tablet   Oral   Take 1 tablet (25 mg total) by mouth every 6 (six) hours as needed for nausea or vomiting.   30 tablet   0   . acetaminophen (TYLENOL) 500 MG tablet   Oral   Take 500 mg by mouth every 6 (six) hours as needed for mild pain or moderate pain.         . diphenoxylate-atropine (LOMOTIL) 2.5-0.025 MG per tablet   Oral   Take 2 tablets by mouth 4 (four) times daily as needed for diarrhea or loose stools.   30 tablet   0   . ondansetron (ZOFRAN-ODT) 4 MG  disintegrating tablet   Oral   Take 1 tablet (4 mg total) by mouth every 8 (eight) hours as needed for nausea or vomiting.   20 tablet   0    BP 127/60  Pulse 95  Temp(Src) 99.1 F (37.3 C) (Oral)  Resp 24  Ht 5\' 5"  (1.651 m)  Wt 240 lb (108.863 kg)  BMI 39.94 kg/m2  SpO2 97% Physical Exam  Nursing note and vitals reviewed.  28 year old male, who appears uncomfortable, but is in no acute distress. Vital signs are significant for tachypnea with respiratory rate of 24. Oxygen saturation is 97%, which is normal. Head is normocephalic and atraumatic. PERRLA, EOMI. Oropharynx is clear. Fundi show no hemorrhage, exudate, or papilledema. Neck is nontender and supple without adenopathy or JVD. Back is nontender and there is no CVA tenderness. Lungs are clear without rales, wheezes, or rhonchi. Chest is nontender. Heart has regular rate and rhythm without murmur. Abdomen is soft, flat, nontender without masses or hepatosplenomegaly and peristalsis is normoactive. Extremities have no cyanosis or edema, full range of motion is present. Skin is warm and dry without rash. Neurologic: Mental status is normal, cranial nerves are intact, there are no motor or sensory deficits.  ED Course  Procedures (including critical care time) Results for orders placed during the hospital encounter of 06/17/13  CBC WITH DIFFERENTIAL      Result Value Ref  Range   WBC 10.7 (*) 4.0 - 10.5 K/uL   RBC 4.85  4.22 - 5.81 MIL/uL   Hemoglobin 13.9  13.0 - 17.0 g/dL   HCT 78.2  95.6 - 21.3 %   MCV 83.1  78.0 - 100.0 fL   MCH 28.7  26.0 - 34.0 pg   MCHC 34.5  30.0 - 36.0 g/dL   RDW 08.6  57.8 - 46.9 %   Platelets 245  150 - 400 K/uL   Neutrophils Relative % 77  43 - 77 %   Neutro Abs 8.2 (*) 1.7 - 7.7 K/uL   Lymphocytes Relative 15  12 - 46 %   Lymphs Abs 1.6  0.7 - 4.0 K/uL   Monocytes Relative 7  3 - 12 %   Monocytes Absolute 0.7  0.1 - 1.0 K/uL   Eosinophils Relative 1  0 - 5 %   Eosinophils Absolute  0.1  0.0 - 0.7 K/uL   Basophils Relative 0  0 - 1 %   Basophils Absolute 0.0  0.0 - 0.1 K/uL  COMPREHENSIVE METABOLIC PANEL      Result Value Ref Range   Sodium 141  137 - 147 mEq/L   Potassium 4.5  3.7 - 5.3 mEq/L   Chloride 102  96 - 112 mEq/L   CO2 27  19 - 32 mEq/L   Glucose, Bld 237 (*) 70 - 99 mg/dL   BUN 10  6 - 23 mg/dL   Creatinine, Ser 6.29  0.50 - 1.35 mg/dL   Calcium 9.3  8.4 - 52.8 mg/dL   Total Protein 6.9  6.0 - 8.3 g/dL   Albumin 3.4 (*) 3.5 - 5.2 g/dL   AST 27  0 - 37 U/L   ALT 57 (*) 0 - 53 U/L   Alkaline Phosphatase 92  39 - 117 U/L   Total Bilirubin 0.4  0.3 - 1.2 mg/dL   GFR calc non Af Amer >90  >90 mL/min   GFR calc Af Amer >90  >90 mL/min   MDM   Final diagnoses:  Migraine headache  Streptococcal pharyngitis    Headache which has characteristics strongly suggestive of migraine headache. He does not appear toxic and is not febrile currently and there is no meningismus to suggest meningitis. Records are reviewed and he had to ED visits for "flulike illness" with vomiting and diarrhea. On the last visit, he had a strep screen done which was negative, but culture came back +2 days ago and he was supposed to have been started on amoxicillin. Patient states he never received a call about the positive culture. He is given a dose of ceftriaxone in the ED and is given IV fluids and will be given a headache cocktail.  He got partial relief with above noted treatment. He is given a dose of ketorolac with excellent further relief of his headache. He is discharged with prescriptions for amoxicillin, metoclopramide, and oxycodone-acetaminophen.  Dione Booze, MD 06/17/13 [phone]

## 2013-06-17 NOTE — Discharge Instructions (Signed)
Your throat culture from last week showed evidence of a strep infection, so you are being given a prescription for an antibiotic.  Migraine Headache A migraine headache is an intense, throbbing pain on one or both sides of your head. A migraine can last for 30 minutes to several hours. CAUSES  The exact cause of a migraine headache is not always known. However, a migraine may be caused when nerves in the brain become irritated and release chemicals that cause inflammation. This causes pain. Certain things may also trigger migraines, such as:  Alcohol.  Smoking.  Stress.  Menstruation.  Aged cheeses.  Foods or drinks that contain nitrates, glutamate, aspartame, or tyramine.  Lack of sleep.  Chocolate.  Caffeine.  Hunger.  Physical exertion.  Fatigue.  Medicines used to treat chest pain (nitroglycerine), birth control pills, estrogen, and some blood pressure medicines. SIGNS AND SYMPTOMS  Pain on one or both sides of your head.  Pulsating or throbbing pain.  Severe pain that prevents daily activities.  Pain that is aggravated by any physical activity.  Nausea, vomiting, or both.  Dizziness.  Pain with exposure to bright lights, loud noises, or activity.  General sensitivity to bright lights, loud noises, or smells. Before you get a migraine, you may get warning signs that a migraine is coming (aura). An aura may include:  Seeing flashing lights.  Seeing bright spots, halos, or zig-zag lines.  Having tunnel vision or blurred vision.  Having feelings of numbness or tingling.  Having trouble talking.  Having muscle weakness. DIAGNOSIS  A migraine headache is often diagnosed based on:  Symptoms.  Physical exam.  A CT scan or MRI of your head. These imaging tests cannot diagnose migraines, but they can help rule out other causes of headaches. TREATMENT Medicines may be given for pain and nausea. Medicines can also be given to help prevent recurrent  migraines.  HOME CARE INSTRUCTIONS  Only take over-the-counter or prescription medicines for pain or discomfort as directed by your health care provider. The use of long-term narcotics is not recommended.  Lie down in a dark, quiet room when you have a migraine.  Keep a journal to find out what may trigger your migraine headaches. For example, write down:  What you eat and drink.  How much sleep you get.  Any change to your diet or medicines.  Limit alcohol consumption.  Quit smoking if you smoke.  Get 7 9 hours of sleep, or as recommended by your health care provider.  Limit stress.  Keep lights dim if bright lights bother you and make your migraines worse. SEEK IMMEDIATE MEDICAL CARE IF:   Your migraine becomes severe.  You have a fever.  You have a stiff neck.  You have vision loss.  You have muscular weakness or loss of muscle control.  You start losing your balance or have trouble walking.  You feel faint or pass out.  You have severe symptoms that are different from your first symptoms. MAKE SURE YOU:   Understand these instructions.  Will watch your condition.  Will get help right away if you are not doing well or get worse. Document Released: 03/17/2005 Document Revised: 01/05/2013 Document Reviewed: 11/22/2012 [facility] Patient Information 2014 [place].  Nausea and Vomiting Nausea is a sick feeling that often comes before throwing up (vomiting). Vomiting is a reflex where stomach contents come out of your mouth. Vomiting can cause severe loss of body fluids (dehydration). Children and elderly adults can become dehydrated quickly,  especially if they also have diarrhea. Nausea and vomiting are symptoms of a condition or disease. It is important to find the cause of your symptoms. CAUSES   Direct irritation of the stomach lining. This irritation can result from increased acid production (gastroesophageal reflux disease), infection, food poisoning,  taking certain medicines (such as nonsteroidal anti-inflammatory drugs), alcohol use, or tobacco use.  Signals from the brain.These signals could be caused by a headache, heat exposure, an inner ear disturbance, increased pressure in the brain from injury, infection, a tumor, or a concussion, pain, emotional stimulus, or metabolic problems.  An obstruction in the gastrointestinal tract (bowel obstruction).  Illnesses such as diabetes, hepatitis, gallbladder problems, appendicitis, kidney problems, cancer, sepsis, atypical symptoms of a heart attack, or eating disorders.  Medical treatments such as chemotherapy and radiation.  Receiving medicine that makes you sleep (general anesthetic) during surgery. DIAGNOSIS Your caregiver may ask for tests to be done if the problems do not improve after a few days. Tests may also be done if symptoms are severe or if the reason for the nausea and vomiting is not clear. Tests may include:  Urine tests.  Blood tests.  Stool tests.  Cultures (to look for evidence of infection).  X-rays or other imaging studies. Test results can help your caregiver make decisions about treatment or the need for additional tests. TREATMENT You need to stay well hydrated. Drink frequently but in small amounts.You may wish to drink water, sports drinks, clear broth, or eat frozen ice pops or gelatin dessert to help stay hydrated.When you eat, eating slowly may help prevent nausea.There are also some antinausea medicines that may help prevent nausea. HOME CARE INSTRUCTIONS   Take all medicine as directed by your caregiver.  If you do not have an appetite, do not force yourself to eat. However, you must continue to drink fluids.  If you have an appetite, eat a normal diet unless your caregiver tells you differently.  Eat a variety of complex carbohydrates (rice, wheat, potatoes, bread), lean meats, yogurt, fruits, and vegetables.  Avoid high-fat foods because they  are more difficult to digest.  Drink enough water and fluids to keep your urine clear or pale yellow.  If you are dehydrated, ask your caregiver for specific rehydration instructions. Signs of dehydration may include:  Severe thirst.  Dry lips and mouth.  Dizziness.  Dark urine.  Decreasing urine frequency and amount.  Confusion.  Rapid breathing or pulse. SEEK IMMEDIATE MEDICAL CARE IF:   You have blood or brown flecks (like coffee grounds) in your vomit.  You have black or bloody stools.  You have a severe headache or stiff neck.  You are confused.  You have severe abdominal pain.  You have chest pain or trouble breathing.  You do not urinate at least once every 8 hours.  You develop cold or clammy skin.  You continue to vomit for longer than 24 to 48 hours.  You have a fever. MAKE SURE YOU:   Understand these instructions.  Will watch your condition.  Will get help right away if you are not doing well or get worse. Document Released: 03/17/2005 Document Revised: 06/09/2011 Document Reviewed: 08/14/2010 [facility] Patient Information 2014 [place], [place].  Amoxicillin capsules or tablets What is this medicine? AMOXICILLIN (a mox i SIL in) is a penicillin antibiotic. It is used to treat certain kinds of bacterial infections. It will not work for colds, flu, or other viral infections. This medicine may be used for other purposes;  ask your health care provider or pharmacist if you have questions. COMMON BRAND NAME(S): Amoxil, Moxilin , Sumox, Trimox What should I tell my health care provider before I take this medicine? They need to know if you have any of these conditions: -asthma -kidney disease -an unusual or allergic reaction to amoxicillin, other penicillins, cephalosporin antibiotics, other medicines, foods, dyes, or preservatives -pregnant or trying to get pregnant -breast-feeding How should I use this medicine? Take this medicine by mouth with a  glass of water. Follow the directions on your prescription label. You may take this medicine with food or on an empty stomach. Take your medicine at regular intervals. Do not take your medicine more often than directed. Take all of your medicine as directed even if you think your are better. Do not skip doses or stop your medicine early. Talk to your pediatrician regarding the use of this medicine in children. While this drug may be prescribed for selected conditions, precautions do apply. Overdosage: If you think you have taken too much of this medicine contact a poison control center or emergency room at once. NOTE: This medicine is only for you. Do not share this medicine with others. What if I miss a dose? If you miss a dose, take it as soon as you can. If it is almost time for your next dose, take only that dose. Do not take double or extra doses. What may interact with this medicine? -amiloride -birth control pills -chloramphenicol -macrolides -probenecid -sulfonamides -tetracyclines This list may not describe all possible interactions. Give your health care provider a list of all the medicines, herbs, non-prescription drugs, or dietary supplements you use. Also tell them if you smoke, drink alcohol, or use illegal drugs. Some items may interact with your medicine. What should I watch for while using this medicine? Tell your doctor or health care professional if your symptoms do not improve in 2 or 3 days. Take all of the doses of your medicine as directed. Do not skip doses or stop your medicine early. If you are diabetic, you may get a false positive result for sugar in your urine with certain brands of urine tests. Check with your doctor. Do not treat diarrhea with over-the-counter products. Contact your doctor if you have diarrhea that lasts more than 2 days or if the diarrhea is severe and watery. What side effects may I notice from receiving this medicine? Side effects that you should  report to your doctor or health care professional as soon as possible: -allergic reactions like skin rash, itching or hives, swelling of the face, lips, or tongue -breathing problems -dark urine -redness, blistering, peeling or loosening of the skin, including inside the mouth -seizures -severe or watery diarrhea -trouble passing urine or change in the amount of urine -unusual bleeding or bruising -unusually weak or tired -yellowing of the eyes or skin Side effects that usually do not require medical attention (report to your doctor or health care professional if they continue or are bothersome): -dizziness -headache -stomach upset -trouble sleeping This list may not describe all possible side effects. Call your doctor for medical advice about side effects. You may report side effects to FDA at 1-800-FDA-1088. Where should I keep my medicine? Keep out of the reach of children. Store between 68 and 77 degrees F (20 and 25 degrees C). Keep bottle closed tightly. Throw away any unused medicine after the expiration date. NOTE: This sheet is a summary. It may not cover all possible information. If you  have questions about this medicine, talk to your doctor, pharmacist, or health care provider.  2014, Elsevier/Gold Standard. (2007-06-08 14:10:59)  Metoclopramide tablets What is this medicine? METOCLOPRAMIDE (met oh kloe PRA mide) is used to treat the symptoms of gastroesophageal reflux disease (GERD) like heartburn. It is also used to treat people with slow emptying of the stomach and intestinal tract. This medicine may be used for other purposes; ask your health care provider or pharmacist if you have questions. COMMON BRAND NAME(S): Reglan What should I tell my health care provider before I take this medicine? They need to know if you have any of these conditions: -breast cancer -depression -diabetes -heart failure -high blood pressure -kidney disease -liver disease -Parkinson's  disease or a movement disorder -pheochromocytoma -seizures -stomach obstruction, bleeding, or perforation -an unusual or allergic reaction to metoclopramide, procainamide, sulfites, other medicines, foods, dyes, or preservatives -pregnant or trying to get pregnant -breast-feeding How should I use this medicine? Take this medicine by mouth with a glass of water. Follow the directions on the prescription label. Take this medicine on an empty stomach, about 30 minutes before eating. Take your doses at regular intervals. Do not take your medicine more often than directed. Do not stop taking except on the advice of your doctor or health care professional. A special MedGuide will be given to you by the pharmacist with each prescription and refill. Be sure to read this information carefully each time. Talk to your pediatrician regarding the use of this medicine in children. Special care may be needed. Overdosage: If you think you have taken too much of this medicine contact a poison control center or emergency room at once. NOTE: This medicine is only for you. Do not share this medicine with others. What if I miss a dose? If you miss a dose, take it as soon as you can. If it is almost time for your next dose, take only that dose. Do not take double or extra doses. What may interact with this medicine? -acetaminophen -cyclosporine -digoxin -medicines for blood pressure -medicines for diabetes, including insulin -medicines for hay fever and other allergies -medicines for depression, especially an Monoamine Oxidase Inhibitor (MAOI) -medicines for Parkinson's disease, like levodopa -medicines for sleep or for pain -tetracycline This list may not describe all possible interactions. Give your health care provider a list of all the medicines, herbs, non-prescription drugs, or dietary supplements you use. Also tell them if you smoke, drink alcohol, or use illegal drugs. Some items may interact with your  medicine. What should I watch for while using this medicine? It may take a few weeks for your stomach condition to start to get better. However, do not take this medicine for longer than 12 weeks. The longer you take this medicine, and the more you take it, the greater your chances are of developing serious side effects. If you are an elderly patient, a male patient, or you have diabetes, you may be at an increased risk for side effects from this medicine. Contact your doctor immediately if you start having movements you cannot control such as lip smacking, rapid movements of the tongue, involuntary or uncontrollable movements of the eyes, head, arms and legs, or muscle twitches and spasms. Patients and their families should watch out for worsening depression or thoughts of suicide. Also watch out for any sudden or severe changes in feelings such as feeling anxious, agitated, panicky, irritable, hostile, aggressive, impulsive, severely restless, overly excited and hyperactive, or not being able to sleep.  If this happens, especially at the beginning of treatment or after a change in dose, call your doctor. Do not treat yourself for high fever. Ask your doctor or health care professional for advice. You may get drowsy or dizzy. Do not drive, use machinery, or do anything that needs mental alertness until you know how this drug affects you. Do not stand or sit up quickly, especially if you are an older patient. This reduces the risk of dizzy or fainting spells. Alcohol can make you more drowsy and dizzy. Avoid alcoholic drinks. What side effects may I notice from receiving this medicine? Side effects that you should report to your doctor or health care professional as soon as possible: -allergic reactions like skin rash, itching or hives, swelling of the face, lips, or tongue -abnormal production of milk in females -breast enlargement in both males and females -change in the way you walk -difficulty  moving, speaking or swallowing -drooling, lip smacking, or rapid movements of the tongue -excessive sweating -fever -involuntary or uncontrollable movements of the eyes, head, arms and legs -irregular heartbeat or palpitations -muscle twitches and spasms -unusually weak or tired Side effects that usually do not require medical attention (report to your doctor or health care professional if they continue or are bothersome): -change in sex drive or performance -depressed mood -diarrhea -difficulty sleeping -headache -menstrual changes -restless or nervous This list may not describe all possible side effects. Call your doctor for medical advice about side effects. You may report side effects to FDA at 1-800-FDA-1088. Where should I keep my medicine? Keep out of the reach of children. Store at room temperature between 20 and 25 degrees C (68 and 77 degrees F). Protect from light. Keep container tightly closed. Throw away any unused medicine after the expiration date. NOTE: This sheet is a summary. It may not cover all possible information. If you have questions about this medicine, talk to your doctor, pharmacist, or health care provider.  2014, Elsevier/Gold Standard. (2011-07-15 13:04:38)  Acetaminophen; Oxycodone tablets What is this medicine? ACETAMINOPHEN; OXYCODONE (a set a MEE noe fen; ox i KOE done) is a pain reliever. It is used to treat mild to moderate pain. This medicine may be used for other purposes; ask your health care provider or pharmacist if you have questions. COMMON BRAND NAME(S): Endocet, Magnacet, Narvox, Percocet, Perloxx, Primalev, Primlev, Roxicet, Xolox What should I tell my health care provider before I take this medicine? They need to know if you have any of these conditions: -brain tumor -Crohn's disease, inflammatory bowel disease, or ulcerative colitis -drug abuse or addiction -head injury -heart or circulation problems -if you often drink  alcohol -kidney disease or problems going to the bathroom -liver disease -lung disease, asthma, or breathing problems -an unusual or allergic reaction to acetaminophen, oxycodone, other opioid analgesics, other medicines, foods, dyes, or preservatives -pregnant or trying to get pregnant -breast-feeding How should I use this medicine? Take this medicine by mouth with a full glass of water. Follow the directions on the prescription label. Take your medicine at regular intervals. Do not take your medicine more often than directed. Talk to your pediatrician regarding the use of this medicine in children. Special care may be needed. Patients over 66 years old may have a stronger reaction and need a smaller dose. Overdosage: If you think you have taken too much of this medicine contact a poison control center or emergency room at once. NOTE: This medicine is only for you. Do not share this  medicine with others. What if I miss a dose? If you miss a dose, take it as soon as you can. If it is almost time for your next dose, take only that dose. Do not take double or extra doses. What may interact with this medicine? -alcohol -antihistamines -barbiturates like amobarbital, butalbital, butabarbital, methohexital, pentobarbital, phenobarbital, thiopental, and secobarbital -benztropine -drugs for bladder problems like solifenacin, trospium, oxybutynin, tolterodine, hyoscyamine, and methscopolamine -drugs for breathing problems like ipratropium and tiotropium -drugs for certain stomach or intestine problems like propantheline, homatropine methylbromide, glycopyrrolate, atropine, belladonna, and dicyclomine -general anesthetics like etomidate, ketamine, nitrous oxide, propofol, desflurane, enflurane, halothane, isoflurane, and sevoflurane -medicines for depression, anxiety, or psychotic disturbances -medicines for sleep -muscle relaxants -naltrexone -narcotic medicines (opiates) for  pain -phenothiazines like perphenazine, thioridazine, chlorpromazine, mesoridazine, fluphenazine, prochlorperazine, promazine, and trifluoperazine -scopolamine -tramadol -trihexyphenidyl This list may not describe all possible interactions. Give your health care provider a list of all the medicines, herbs, non-prescription drugs, or dietary supplements you use. Also tell them if you smoke, drink alcohol, or use illegal drugs. Some items may interact with your medicine. What should I watch for while using this medicine? Tell your doctor or health care professional if your pain does not go away, if it gets worse, or if you have new or a different type of pain. You may develop tolerance to the medicine. Tolerance means that you will need a higher dose of the medication for pain relief. Tolerance is normal and is expected if you take this medicine for a long time. Do not suddenly stop taking your medicine because you may develop a severe reaction. Your body becomes used to the medicine. This does NOT mean you are addicted. Addiction is a behavior related to getting and using a drug for a non-medical reason. If you have pain, you have a medical reason to take pain medicine. Your doctor will tell you how much medicine to take. If your doctor wants you to stop the medicine, the dose will be slowly lowered over time to avoid any side effects. You may get drowsy or dizzy. Do not drive, use machinery, or do anything that needs mental alertness until you know how this medicine affects you. Do not stand or sit up quickly, especially if you are an older patient. This reduces the risk of dizzy or fainting spells. Alcohol may interfere with the effect of this medicine. Avoid alcoholic drinks. There are different types of narcotic medicines (opiates) for pain. If you take more than one type at the same time, you may have more side effects. Give your health care provider a list of all medicines you use. Your doctor will  tell you how much medicine to take. Do not take more medicine than directed. Call emergency for help if you have problems breathing. The medicine will cause constipation. Try to have a bowel movement at least every 2 to 3 days. If you do not have a bowel movement for 3 days, call your doctor or health care professional. Do not take Tylenol (acetaminophen) or medicines that have acetaminophen with this medicine. Too much acetaminophen can be very dangerous. Many nonprescription medicines contain acetaminophen. Always read the labels carefully to avoid taking more acetaminophen. What side effects may I notice from receiving this medicine? Side effects that you should report to your doctor or health care professional as soon as possible: -allergic reactions like skin rash, itching or hives, swelling of the face, lips, or tongue -breathing difficulties, wheezing -confusion -light headedness or  fainting spells -severe stomach pain -unusually weak or tired -yellowing of the skin or the whites of the eyes  Side effects that usually do not require medical attention (report to your doctor or health care professional if they continue or are bothersome): -dizziness -drowsiness -nausea -vomiting This list may not describe all possible side effects. Call your doctor for medical advice about side effects. You may report side effects to FDA at 1-800-FDA-1088. Where should I keep my medicine? Keep out of the reach of children. This medicine can be abused. Keep your medicine in a safe place to protect it from theft. Do not share this medicine with anyone. Selling or giving away this medicine is dangerous and against the law. Store at room temperature between 20 and 25 degrees C (68 and 77 degrees F). Keep container tightly closed. Protect from light. This medicine may cause accidental overdose and death if it is taken by other adults, children, or pets. Flush any unused medicine down the toilet to reduce the  chance of harm. Do not use the medicine after the expiration date. NOTE: This sheet is a summary. It may not cover all possible information. If you have questions about this medicine, talk to your doctor, pharmacist, or health care provider.  2014, Elsevier/Gold Standard. (2012-11-08 13:17:35)

## 2013-06-21 NOTE — ED Notes (Signed)
Unable to contact via phone.'Letter sent to [facility] address. 

## 2013-11-18 ENCOUNTER — Encounter (HOSPITAL_COMMUNITY): Payer: Self-pay | Admitting: Emergency Medicine

## 2013-11-18 ENCOUNTER — Emergency Department (HOSPITAL_COMMUNITY)
Admission: EM | Admit: 2013-11-18 | Discharge: 2013-11-18 | Disposition: A | Discharge status: Home / Self Care | Payer: Self-pay | Attending: Emergency Medicine | Admitting: Emergency Medicine

## 2013-11-18 DIAGNOSIS — Z792 Long term (current) use of antibiotics: Secondary | ICD-10-CM | POA: Insufficient documentation

## 2013-11-18 DIAGNOSIS — K0889 Other specified disorders of teeth and supporting structures: Secondary | ICD-10-CM

## 2013-11-18 DIAGNOSIS — Z79899 Other long term (current) drug therapy: Secondary | ICD-10-CM | POA: Insufficient documentation

## 2013-11-18 DIAGNOSIS — K0381 Cracked tooth: Secondary | ICD-10-CM | POA: Insufficient documentation

## 2013-11-18 DIAGNOSIS — I498 Other specified cardiac arrhythmias: Secondary | ICD-10-CM | POA: Insufficient documentation

## 2013-11-18 DIAGNOSIS — R001 Bradycardia, unspecified: Secondary | ICD-10-CM

## 2013-11-18 DIAGNOSIS — K089 Disorder of teeth and supporting structures, unspecified: Secondary | ICD-10-CM | POA: Insufficient documentation

## 2013-11-18 MED ORDER — CLINDAMYCIN HCL 150 MG PO CAPS: 300.0000 mg | ORAL_CAPSULE | Freq: Three times a day (TID) | ORAL | Status: DC

## 2013-11-18 MED ORDER — HYDROCODONE-ACETAMINOPHEN 5-325 MG PO TABS: 1.0000 | ORAL_TABLET | Freq: Four times a day (QID) | ORAL | Status: DC | PRN

## 2013-11-18 MED ORDER — KETOROLAC TROMETHAMINE 60 MG/2ML IM SOLN
60.0000 mg | Freq: Once | INTRAMUSCULAR | Status: AC
  Administered 2013-11-18: 60 mg via INTRAMUSCULAR
  Filled 2013-11-18: qty 2

## 2013-11-18 MED ORDER — CLINDAMYCIN HCL 150 MG PO CAPS
300.0000 mg | ORAL_CAPSULE | Freq: Once | ORAL | Status: AC
  Administered 2013-11-18: 300 mg via ORAL
  Filled 2013-11-18: qty 2

## 2013-11-18 NOTE — ED Provider Notes (Signed)
CSN: [id number]     Arrival date & time 11/18/13  1425 History   First MD Initiated Contact with Patient 11/18/13 1518     Chief Complaint  Patient presents with  . Dental Pain     (Consider location/radiation/quality/duration/timing/severity/associated sxs/prior Treatment) HPI Comments: Patient is a 28 year old otherwise healthy male who presents to the emergency department for dental pain. He reports that the pain started yesterday. It is a pulsatile, throbbing pain in his left lower tooth. He has taken 2 muscle relaxers and a Percocet without relief of his symptoms. He has used BC powder. This has happened to him in the past, but he does not have dental insurance and cannot see a dentist. He reports having pain with swallowing. No fevers, chills.  The history is provided by the patient. No language interpreter was used.    History reviewed. No pertinent past medical history. History reviewed. No pertinent past surgical history. No family history on file. History  Substance Use Topics  . Smoking status: Never Smoker   . Smokeless tobacco: Not on file  . Alcohol Use: No    Review of Systems  Constitutional: Negative for fever and chills.  HENT: Positive for dental problem and trouble swallowing (pain with swallowing). Negative for drooling.   Respiratory: Negative for shortness of breath.   Cardiovascular: Negative for chest pain.  All other systems reviewed and are negative.     Allergies  Review of patient's allergies indicates no known allergies.  Home Medications   Prior to Admission medications   Medication Sig Start Date End Date Taking? Authorizing Provider  acetaminophen (TYLENOL) 500 MG tablet Take 500 mg by mouth every 6 (six) hours as needed for mild pain or moderate pain.    Historical Provider, MD  amoxicillin (AMOXIL) 500 MG capsule Take 2 capsules (1,000 mg total) by mouth 2 (two) times daily. 06/17/13   Dione Booze, MD  diphenoxylate-atropine (LOMOTIL)  2.5-0.025 MG per tablet Take 2 tablets by mouth 4 (four) times daily as needed for diarrhea or loose stools. 06/07/13   Gilda Crease, MD  metoCLOPramide (REGLAN) 10 MG tablet Take 1 tablet (10 mg total) by mouth every 6 (six) hours as needed for nausea (or headache). 06/17/13   Dione Booze, MD  ondansetron (ZOFRAN-ODT) 4 MG disintegrating tablet Take 1 tablet (4 mg total) by mouth every 8 (eight) hours as needed for nausea or vomiting. 06/10/13   Shon Baton, MD  oxyCODONE-acetaminophen (PERCOCET/ROXICET) 5-325 MG per tablet Take 1 tablet by mouth every 4 (four) hours as needed. 06/17/13   Dione Booze, MD  promethazine (PHENERGAN) 25 MG tablet Take 1 tablet (25 mg total) by mouth every 6 (six) hours as needed for nausea or vomiting. 06/07/13   Gilda Crease, MD   BP 116/59  Pulse 72  Temp(Src) 98.6 F (37 C) (Oral)  Resp 16  Ht 5\' 7"  (1.702 m)  Wt 240 lb (108.863 kg)  BMI 37.58 kg/m2  SpO2 100% Physical Exam  Nursing note and vitals reviewed. Constitutional: He is oriented to person, place, and time. He appears well-developed and well-nourished. No distress.  HENT:  Head: Normocephalic and atraumatic.  Right Ear: External ear normal.  Left Ear: External ear normal.  Nose: Nose normal.  Generally poor dentition. A broken left lower molar. There is no visible swelling. No trismus. Patient easily maintaining own secretions. No submental edema or tongue elevation.   Eyes: Conjunctivae are normal.  Neck: Normal range of motion. No tracheal deviation  present.  Cardiovascular: Regular rhythm and normal heart sounds.  Bradycardia present.   Pulmonary/Chest: Effort normal and breath sounds normal. No stridor.  Abdominal: Soft. He exhibits no distension. There is no tenderness.  Musculoskeletal: Normal range of motion.  Neurological: He is alert and oriented to person, place, and time.  Skin: Skin is warm and dry. He is not diaphoretic.  Psychiatric: He has a normal mood  and affect. His behavior is normal.    ED Course  Procedures (including critical care time) Labs Review Labs Reviewed - No data to display  Imaging Review No results found.   EKG Interpretation   Date/Time:  Friday November 18 2013 15:42:59 EDT Ventricular Rate:  39 PR Interval:  142 QRS Duration: 100 QT Interval:  474 QTC Calculation: 381 R Axis:   18 Text Interpretation:  Marked sinus bradycardia Otherwise within normal  limits No old tracing to compare Confirmed by KNAPP  MD-I, IVA ([id number]) on  11/18/2013 4:00:50 PM      MDM   Final diagnoses:  Pain, dental  Bradycardia    Patient with toothache.  No gross abscess.  Exam unconcerning for Ludwig's angina or spread of infection.  Will treat with clindamycin and pain medicine.  Urged patient to follow-up with dentist.    Patient also with bradycardia. No family history of sudden death. Heart rate was in the 70s with ambulation. He was encouraged to follow up with primary care provider. Discussed reasons to return to emergency Department immediately. Vital signs stable for discharge.     Mora BellmanHannah S Lacey Dotson, PA-C 11/18/13 1651

## 2013-11-18 NOTE — Discharge Instructions (Signed)
Dental Pain A tooth ache may be caused by cavities (tooth decay). Cavities expose the nerve of the tooth to air and hot or cold temperatures. It may come from an infection or abscess (also called a boil or furuncle) around your tooth. It is also often caused by dental caries (tooth decay). This causes the pain you are having. DIAGNOSIS  Your caregiver can diagnose this problem by exam. TREATMENT   If caused by an infection, it may be treated with medications which kill germs (antibiotics) and pain medications as prescribed by your caregiver. Take medications as directed.  Only take over-the-counter or prescription medicines for pain, discomfort, or fever as directed by your caregiver.  Whether the tooth ache today is caused by infection or dental disease, you should see your dentist as soon as possible for further care. SEEK MEDICAL CARE IF: The exam and treatment you received today has been provided on an emergency basis only. This is not a substitute for complete medical or dental care. If your problem worsens or new problems (symptoms) appear, and you are unable to meet with your dentist, call or return to this location. SEEK IMMEDIATE MEDICAL CARE IF:   You have a fever.  You develop redness and swelling of your face, jaw, or neck.  You are unable to open your mouth.  You have severe pain uncontrolled by pain medicine. MAKE SURE YOU:   Understand these instructions.  Will watch your condition.  Will get help right away if you are not doing well or get worse. Document Released: 03/17/2005 Document Revised: 06/09/2011 Document Reviewed: 11/03/2007 [facility] Patient Information 2015 [place]. This information is not intended to replace advice given to you by your health care provider. Make sure you discuss any questions you have with your health care provider.  Bradycardia Bradycardia is a term for a heart rate (pulse) that, in adults, is slower than 60 beats per minute. A  normal rate is 60 to 100 beats per minute. A heart rate below 60 beats per minute may be normal for some adults with healthy hearts. If the rate is too slow, the heart may have trouble pumping the volume of blood the body needs. If the heart rate gets too low, blood flow to the brain may be decreased and may make you feel lightheaded, dizzy, or faint. The heart has a natural pacemaker in the top of the heart called the SA node (sinoatrial or sinus node). This pacemaker sends out regular electrical signals to the muscle of the heart, telling the heart muscle when to beat (contract). The electrical signal travels from the upper parts of the heart (atria) through the AV node (atrioventricular node), to the lower chambers of the heart (ventricles). The ventricles squeeze, pumping the blood from your heart to your lungs and to the rest of your body. CAUSES   Problem with the heart's electrical system.  Problem with the heart's natural pacemaker.  Heart disease, damage, or infection.  Medications.  Problems with minerals and salts (electrolytes). SYMPTOMS   Fainting (syncope).  Fatigue and weakness.  Shortness of breath (dyspnea).  Chest pain (angina).  Drowsiness.  Confusion. DIAGNOSIS   An electrocardiogram (ECG) can help your caregiver determine the type of slow heart rate you have.  If the cause is not seen on an ECG, you may need to wear a heart monitor that records your heart rhythm for several hours or days.  Blood tests. TREATMENT   Electrolyte supplements.  Medications.  Withholding medication which  is causing a slow heart rate.  Pacemaker placement. SEEK IMMEDIATE MEDICAL CARE IF:   You feel lightheaded or faint.  You develop an irregular heart rate.  You feel chest pain or have trouble breathing. MAKE SURE YOU:   Understand these instructions.  Will watch your condition.  Will get help right away if you are not doing well or get worse. Document Released:  12/07/2001 Document Revised: 06/09/2011 Document Reviewed: 06/22/2013 [facility] Patient Information 2015 [place], [place]. This information is not intended to replace advice given to you by your health care provider. Make sure you discuss any questions you have with your health care provider.  Return to the emergency room for fever, change in vision, redness to the face that rapidly spreads towards the eye, nausea or vomiting, difficulty swallowing or shortness of breath.  You may follow with the dentist listed above. Alternatively,  you may also contact Onalee Hua and Nuala Alpha who run a low-cost dental clinic at their phone number is [phone]. You may also call [phone]  Dental Assistance If the dentist on-call cannot see you, please use the resources below:   Patients with Medicaid: [facility] [phone] [address], [phone] [address] [phone]  If unable to pay, or uninsured, contact [facility] [phone]) or [facility] Department [phone] in [place], [phone] in [facility]) to become qualified for the adult dental clinic  Other Low-Cost Community Dental Services: [facility] [address] Natasha Bence [place], [place], [address]    [phone], Ext. [phone]    2nd and 4th Thursday of the month at 6:30am    10 clients each day by appointment, can sometimes see walk-in     patients if someone does not show for an appointment [facility]- [address] Ether Griffins [place], [place], [address]    [phone] [facility] [address], [place], [place], [address]    [phone]  [facility] Health Department- [phone] [facility] Health Department- [phone] [facility] Department- [phone]

## 2013-11-18 NOTE — ED Notes (Signed)
Dental pain to left side starting yesterday.

## 2013-11-18 NOTE — ED Notes (Signed)
Patient with no complaints at this time. Respirations even and unlabored. Skin warm/dry. Discharge instructions reviewed with patient at this time.  Provided dental resource list. Patient given opportunity to voice concerns/ask questions. Patient discharged at this time and left Emergency Department with steady gait.

## 2013-11-21 NOTE — ED Provider Notes (Signed)
Medical screening examination/treatment/procedure(s) were performed by non-physician practitioner and as supervising physician I was immediately available for consultation/collaboration.   EKG Interpretation   Date/Time:  Friday November 18 2013 15:42:59 EDT Ventricular Rate:  39 PR Interval:  142 QRS Duration: 100 QT Interval:  474 QTC Calculation: 381 R Axis:   18 Text Interpretation:  Marked sinus bradycardia Otherwise within normal  limits No old tracing to compare Confirmed by KNAPP  MD-I, IVA ([id number]) on  11/18/2013 4:00:50 PM        Benny Lennert, MD 11/21/13 [phone]

## 2014-10-13 ENCOUNTER — Emergency Department
Admission: EM | Admit: 2014-10-13 | Discharge: 2014-10-13 | Disposition: A | Discharge status: Home / Self Care | Payer: Self-pay | Attending: Emergency Medicine | Admitting: Emergency Medicine

## 2014-10-13 ENCOUNTER — Other Ambulatory Visit: Payer: Self-pay

## 2014-10-13 DIAGNOSIS — Z7982 Long term (current) use of aspirin: Secondary | ICD-10-CM | POA: Insufficient documentation

## 2014-10-13 DIAGNOSIS — F410 Panic disorder [episodic paroxysmal anxiety] without agoraphobia: Secondary | ICD-10-CM | POA: Insufficient documentation

## 2014-10-13 MED ORDER — LORAZEPAM 0.5 MG PO TABS
0.5000 mg | ORAL_TABLET | Freq: Once | ORAL | Status: AC
  Administered 2014-10-13: 0.5 mg via ORAL
  Filled 2014-10-13: qty 1

## 2014-10-13 NOTE — Discharge Instructions (Signed)

## 2014-10-13 NOTE — ED Notes (Signed)
EKG performed.

## 2014-10-13 NOTE — ED Notes (Signed)
Patient presents to ED complaining of chest pain stating that he is pretty sure it's a panic attack. States he just found out is estranged wife is here and tried to kill herself because they split up about three weeks ago. Patient is tearful. States he has had these types of attacks before.

## 2014-10-13 NOTE — ED Provider Notes (Signed)
[facility] Emergency Department Provider Note  ____________________________________________  Time seen: 5:45 AM  I have reviewed the triage vital signs and the nursing notes.   HISTORY  Chief Complaint Panic Attack      HPI Peter Peter. is a 29 y.o. male resents with "panic attack". Patient states that his estranged wife attempted to kill herself today. Of note the patient's wife is currently being treated in the emergency department here at [facility] for before mentioned. Patient stated upon hearing about his wife's attempted suicide attempt he started to experience chest tightness and generalized feeling of anxiety consistent with previous panic attacks.     Past medical history Panic attacks  Past surgical history None  Current Outpatient Rx  Name  Route  Sig  Dispense  Refill  . acetaminophen (TYLENOL) 500 MG tablet   Oral   Take 500 mg by mouth every 6 (six) hours as needed for mild pain or moderate pain.         . Aspirin-Salicylamide-Caffeine (BC HEADACHE) 325-95-16 MG TABS   Oral   Take 1 packet by mouth as needed (for dental pain).         . clindamycin (CLEOCIN) 150 MG capsule   Oral   Take 2 capsules (300 mg total) by mouth 3 (three) times daily. May dispense as  capsules   60 capsule   0   . HYDROcodone-acetaminophen (NORCO/VICODIN) 5-325 MG per tablet   Oral   Take 1-2 tablets by mouth every 6 (six) hours as needed for moderate pain or severe pain.   12 tablet   0     Allergies Review of patient's allergies indicates no known allergies.  No family history on file.  Social History History  Substance Use Topics  . Smoking status: Never Smoker   . Smokeless tobacco: Not on file  . Alcohol Use: No    Review of Systems  Constitutional: Negative for fever. Eyes: Negative for visual changes. ENT: Negative for sore throat. Cardiovascular: Negative for chest pain. Respiratory: Negative for  shortness of breath. Gastrointestinal: Negative for abdominal pain, vomiting and diarrhea. Genitourinary: Negative for dysuria. Musculoskeletal: Negative for back pain. Skin: Negative for rash. Neurological: Negative for headaches, focal weakness or numbness. Psychiatric: Positive for Panic attack and anxiety  10-point ROS otherwise negative.  ____________________________________________   PHYSICAL EXAM:  VITAL SIGNS: ED Triage Vitals  Enc Vitals Group     BP 10/13/14 0259 138/80 mmHg     Pulse Rate 10/13/14 0542 52     Resp 10/13/14 0542 18     Temp 10/13/14 0259 98.1 F (36.7 C)     Temp Source 10/13/14 0259 Oral     SpO2 10/13/14 0542 100 %     Weight 10/13/14 0259 230 lb (104.327 kg)     Height 10/13/14 0259  (1.727 m)     Head Cir --      Peak Flow --      Pain Score 10/13/14 0302 10     Pain Loc --      Pain Edu? --      Excl. in GC? --      Constitutional: Alert and oriented. Well appearing and in no distress. Eyes: Conjunctivae are normal. PERRL. Normal extraocular movements. ENT   Head: Normocephalic and atraumatic.   Nose: No congestion/rhinnorhea.   Mouth/Throat: Mucous membranes are moist.   Neck: No stridor. Hematological/Lymphatic/Immunilogical: No cervical lymphadenopathy. Cardiovascular: Normal rate, regular rhythm. Normal and symmetric  distal pulses are present in all extremities. No murmurs, rubs, or gallops. Respiratory: Normal respiratory effort without tachypnea nor retractions. Breath sounds are clear and equal bilaterally. No wheezes/rales/rhonchi. Gastrointestinal: Soft and nontender. No distention. There is no CVA tenderness. Genitourinary: deferred Musculoskeletal: Nontender with normal range of motion in all extremities. No joint effusions.  No lower extremity tenderness nor edema. Neurologic:  Normal speech and language. No gross focal neurologic deficits are appreciated. Speech is normal.  Skin:  Skin is warm, dry and  intact. No rash noted. Psychiatric: Mood and affect are normal. Speech and behavior are normal. Patient exhibits appropriate insight and judgment.  ____________________________________________    INITIAL IMPRESSION / ASSESSMENT AND PLAN / ED COURSE  Pertinent labs & imaging results that were available during my care of the patient were reviewed by me and considered in my medical decision making (see chart for details).  Strip physical exam consistent with panic attack patient states symptoms of since improved. Patient having no chest tightness or pain at this time.  ____________________________________________   FINAL CLINICAL IMPRESSION(S) / ED DIAGNOSES  Final diagnoses:  Panic attack      Peter Currentandolph N Brown, MD 10/13/14 [phone]

## 2014-10-31 ENCOUNTER — Emergency Department
Admission: EM | Admit: 2014-10-31 | Discharge: 2014-10-31 | Disposition: A | Discharge status: Home / Self Care | Payer: Self-pay | Attending: Emergency Medicine | Admitting: Emergency Medicine

## 2014-10-31 ENCOUNTER — Encounter: Payer: Self-pay | Admitting: Emergency Medicine

## 2014-10-31 DIAGNOSIS — K088 Other specified disorders of teeth and supporting structures: Secondary | ICD-10-CM | POA: Insufficient documentation

## 2014-10-31 DIAGNOSIS — K0889 Other specified disorders of teeth and supporting structures: Secondary | ICD-10-CM

## 2014-10-31 DIAGNOSIS — K0381 Cracked tooth: Secondary | ICD-10-CM | POA: Insufficient documentation

## 2014-10-31 MED ORDER — LIDOCAINE HCL (PF) 1 % IJ SOLN
5.0000 mL | Freq: Once | INTRAMUSCULAR | Status: AC
  Administered 2014-10-31: 5 mL
  Filled 2014-10-31: qty 5

## 2014-10-31 MED ORDER — SULFAMETHOXAZOLE-TRIMETHOPRIM 800-160 MG PO TABS: 1.0000 | ORAL_TABLET | Freq: Two times a day (BID) | ORAL | Status: DC

## 2014-10-31 MED ORDER — BUPIVACAINE HCL (PF) 0.5 % IJ SOLN
30.0000 mL | Freq: Once | INTRAMUSCULAR | Status: AC
  Administered 2014-10-31: 5 mL
  Filled 2014-10-31: qty 30

## 2014-10-31 MED ORDER — HYDROCODONE-ACETAMINOPHEN 5-325 MG PO TABS: 1.0000 | ORAL_TABLET | ORAL | Status: DC | PRN

## 2014-10-31 NOTE — ED Notes (Signed)
Pt ambulating independently w/ steady gait on d/c in no acute distress, A&Ox4. D/c instructions reviewed w/ pt and family - pt and family deny any further questions or concerns at present. Rx given x2  

## 2014-10-31 NOTE — Discharge Instructions (Signed)
Dental Pain °A tooth ache may be caused by cavities (tooth decay). Cavities expose the nerve of the tooth to air and hot or cold temperatures. It may come from an infection or abscess (also called a boil or furuncle) around your tooth. It is also often caused by dental caries (tooth decay). This causes the pain you are having. °DIAGNOSIS  °Your caregiver can diagnose this problem by exam. °TREATMENT  °· If caused by an infection, it may be treated with medications which kill germs (antibiotics) and pain medications as prescribed by your caregiver. Take medications as directed. °· Only take over-the-counter or prescription medicines for pain, discomfort, or fever as directed by your caregiver. °· Whether the tooth ache today is caused by infection or dental disease, you should see your dentist as soon as possible for further care. °SEEK MEDICAL CARE IF: °The exam and treatment you received today has been provided on an emergency basis only. This is not a substitute for complete medical or dental care. If your problem worsens or new problems (symptoms) appear, and you are unable to meet with your dentist, call or return to this location. °SEEK IMMEDIATE MEDICAL CARE IF:  °· You have a fever. °· You develop redness and swelling of your face, jaw, or neck. °· You are unable to open your mouth. °· You have severe pain uncontrolled by pain medicine. °MAKE SURE YOU:  °· Understand these instructions. °· Will watch your condition. °· Will get help right away if you are not doing well or get worse. °Document Released: 03/17/2005 Document Revised: 06/09/2011 Document Reviewed: 11/03/2007 °ExitCare® Patient Information ©2015 [employer]. This information is not intended to replace advice given to you by your health care provider. Make sure you discuss any questions you have with your health care provider. °OPTIONS FOR DENTAL FOLLOW UP CARE ° ° Department of Health and Human Services - Local Safety Net Dental  Clinics °http://www.ncdhhs.gov/dph/oralhealth/services/safetynetclinics.htm °  °[facility] (336-562-3123) ° °[facility] (919-933-9087) ° °[facility] (919-663-1744 ext 237) ° °[facility] (336-570-6415) ° °[facility] (919-968-2025) °This clinic caters to the indigent population and is on a lottery system. °Location: °[facility], [address], [place] °Clinic Hours: °Wednesdays from 6pm - 9pm, patients seen by a lottery system. °For dates, call or go to www.med.[facility].edu/[facility]/patients/Dental-[facility] °Services: °Cleanings, fillings and simple extractions. °Payment Options: °DENTAL WORK IS FREE OF CHARGE. Bring proof of income or support. °Best way to get seen: °Arrive at 5:15 pm - this is a lottery, NOT first come/first serve, so arriving earlier will not increase your chances of being seen. °  °  °[facility] °919-537-3737 °Select option 1 for emergencies °  °Location: °[facility], [address], [place] °Clinic Hours: °No walk-ins accepted - call the day before to schedule an appointment. °Check in times are 9:30 am and 1:30 pm. °Services: °Simple extractions, temporary fillings, pulpectomy/pulp debridement, uncomplicated abscess drainage. °Payment Options: °PAYMENT IS DUE AT THE TIME OF SERVICE.  Fee is usually $100-200, additional surgical procedures (e.g. abscess drainage) may be extra. °Cash, checks, Visa/MasterCard accepted.  Can file Medicaid if patient is covered for dental - patient should call case worker to check. °No discount for [facility] patients. °Best way to get seen: °MUST call the day before and get onto the schedule. Can usually be seen the next 1-2 days. No walk-ins accepted. °  °  °[facility] °919-933-9087 °  °Location: °[facility], [address], [place] °Clinic Hours: °M, W, Th, F 8am or 1:30pm, Tues  9a or 1:30 - first come/first served. °Services: °Simple extractions, temporary fillings, uncomplicated abscess drainage.  You do not need to be an [place] resident. °Payment Options: °PAYMENT IS DUE AT THE TIME OF SERVICE. °Dental insurance, otherwise sliding scale - bring proof of income or support. °Depending on income and treatment needed, cost is usually $50-200. °Best way to get seen: °Arrive early as it is first come/first served. °  °  °[facility] °919-542-1641 °  °Location: °[address] °Clinic Hours: °Mon-Thu 8a-5p °Services: °Most basic dental services including extractions and fillings. °Payment Options: °PAYMENT IS DUE AT THE TIME OF SERVICE. °Sliding scale, up to 50% off - bring proof if income or support. °Medicaid with dental option accepted. °Best way to get seen: °Call to schedule an appointment, can usually be seen within 2 weeks OR they will try to see walk-ins - show up at 8a or 2p (you may have to wait). °  °  °[facility] °919-245-2435 °[place] RESIDENTS ONLY °  °Location: °[facility], [address], [place], [place] [address] °Clinic Hours: By appointment only. °Monday - Thursday 8am-5pm, Friday 8am-12pm °Services: Cleanings, fillings, extractions. °Payment Options: °PAYMENT IS DUE AT THE TIME OF SERVICE. °Cash, Visa or MasterCard. Sliding scale - $30 minimum per service. °Best way to get seen: °Come in to office, complete packet and make an appointment - need proof of income °or support monies for each household member and proof of [place] residence. °Usually takes about a month to get in. °  °  °[facility] °919-956-4038 °  °Location: °[address] [place] °Clinic Hours: Walk-in Urgent Care Dental Services are offered Monday-Friday mornings only. °The numbers of emergencies accepted daily is limited to the number of °providers available. °Maximum 15 -  Mondays, Wednesdays & Thursdays °Maximum 10 - Tuesdays & Fridays °Services: °You do not need to be a [place] resident to be seen for a dental emergency. °Emergencies are defined as pain, swelling, abnormal bleeding, or dental trauma. Walkins will receive x-rays if needed. °NOTE: Dental cleaning is not an emergency. °Payment Options: °PAYMENT IS DUE AT THE TIME OF SERVICE. °Minimum co-pay is $40.00 for uninsured patients. °Minimum co-pay is $3.00 for Medicaid with dental coverage. °Dental Insurance is accepted and must be presented at time of visit. °Medicare does not cover dental. °Forms of payment: Cash, credit card, checks. °Best way to get seen: °If not previously registered with the clinic, walk-in dental registration begins at 7:15 am and is on a first come/first serve basis. °If previously registered with the clinic, call to make an appointment. °  °  °[facility] °919-776-4359 °[place] RESIDENTS ONLY °  °Location: °[address], [place], [place] °Clinic Hours: °Mon-Thu 10a-2p °Services: Extractions only! °Payment Options: °FREE (donations accepted) - bring proof of income or support °Best way to get seen: °Call and schedule an appointment OR come at 8am on the 1st Monday of every month (except for holidays) when it is first come/first served. °  °  °[facility] °[phone] °  °Location: °[address], [place] °Clinic Hours: °Friday mornings °Services, Payment Options, Best way to get seen: °Call for info °

## 2014-10-31 NOTE — ED Notes (Signed)
Patient ambulatory to triage with steady gait, without difficulty or distress noted; pt reports left lower dental pain "for awhile"

## 2014-10-31 NOTE — ED Provider Notes (Signed)
[facility] Emergency Department Provider Note ____________________________________________  Time seen: Approximately 8:51 PM  I have reviewed the triage vital signs and the nursing notes.   HISTORY  Chief Complaint Dental Pain  HPI Peter Buckley. is a 29 y.o. male who presents to the emergency department for dental pain. He states the pain has been present for some time but has become worse over the last few days.He has been taking Tylenol and ibuprofen for pain with little relief.   History reviewed. No pertinent past medical history.  There are no active problems to display for this patient.   History reviewed. No pertinent past surgical history.  Current Outpatient Rx  Name  Route  Sig  Dispense  Refill  . HYDROcodone-acetaminophen (NORCO/VICODIN) 5-325 MG per tablet   Oral   Take 1 tablet by mouth every 4 (four) hours as needed.   12 tablet   0   . sulfamethoxazole-trimethoprim (BACTRIM DS,SEPTRA DS) 800-160 MG per tablet   Oral   Take 1 tablet by mouth 2 (two) times daily.   20 tablet   0     Allergies Review of patient's allergies indicates no known allergies.  No family history on file.  Social History History  Substance Use Topics  . Smoking status: Never Smoker   . Smokeless tobacco: Not on file  . Alcohol Use: No    Review of Systems Constitutional: No fever/chills Eyes: No visual changes. ENT: No sore throat. Cardiovascular: Denies chest pain. Respiratory: Denies shortness of breath. Gastrointestinal: No abdominal pain.  No nausea, no vomiting.  Genitourinary: Negative for dysuria. Musculoskeletal: Negative for back pain. Skin: Negative for rash. Neurological: Negative for headaches, focal weakness or numbness. 10-point ROS otherwise negative.  ____________________________________________   PHYSICAL EXAM:  VITAL SIGNS: ED Triage Vitals  Enc Vitals Group     BP 10/31/14 1957 162/83 mmHg     Pulse Rate  10/31/14 1957 68     Resp 10/31/14 1957 18     Temp 10/31/14 1957 98.5 F (36.9 C)     Temp Source 10/31/14 1957 Oral     SpO2 10/31/14 1957 98 %     Weight 10/31/14 1957 238 lb (107.956 kg)     Height 10/31/14 1957 5\' 5"  (1.651 m)     Head Cir --      Peak Flow --      Pain Score 10/31/14 1958 10     Pain Loc --      Pain Edu? --      Excl. in GC? --     Constitutional: Alert and oriented. Well appearing and in no acute distress. Eyes: Conjunctivae are normal. PERRL. EOMI. Head: Atraumatic. Nose: No congestion/rhinnorhea. Mouth/Throat: Mucous membranes are moist.  Oropharynx non-erythematous. Periodontal Exam    Neck: No stridor.  Hematological/Lymphatic/Immunilogical: No cervical lymphadenopathy. Cardiovascular:   Good peripheral circulation. Respiratory: Normal respiratory effort.  No retractions. Musculoskeletal: No lower extremity tenderness nor edema.  No joint effusions. Neurologic:  Normal speech and language. No gross focal neurologic deficits are appreciated. Speech is normal. No gait instability. Skin:  Skin is warm, dry and intact. No rash noted. Psychiatric: Mood and affect are normal. Speech and behavior are normal.  ____________________________________________   LABS (all labs ordered are listed, but only abnormal results are displayed)  Labs Reviewed - No data to display ____________________________________________   RADIOLOGY  Not indicated ____________________________________________   PROCEDURES  Procedure(s) performed: None  Critical Care performed: No  ____________________________________________  INITIAL IMPRESSION / ASSESSMENT AND PLAN / ED COURSE  Pertinent labs & imaging results that were available during my care of the patient were reviewed by me and considered in my medical decision making (see chart for details).  Patient was advised to see the dentist within 14 days. Also advised to take the antibiotic until finished.  Instructed to return to the ER for symptoms that change or worsen if you are unable to schedule an appointment. ____________________________________________   FINAL CLINICAL IMPRESSION(S) / ED DIAGNOSES  Final diagnoses:  Pain, dental      Chinita Pester, FNP 10/31/14 2251  Minna Antis, MD 10/31/14 [phone]

## 2015-02-09 ENCOUNTER — Emergency Department
Admission: EM | Admit: 2015-02-09 | Discharge: 2015-02-09 | Disposition: A | Discharge status: Home / Self Care | Payer: Self-pay | Attending: Emergency Medicine | Admitting: Emergency Medicine

## 2015-02-09 ENCOUNTER — Encounter: Payer: Self-pay | Admitting: *Deleted

## 2015-02-09 ENCOUNTER — Emergency Department: Payer: Self-pay

## 2015-02-09 DIAGNOSIS — Y998 Other external cause status: Secondary | ICD-10-CM | POA: Insufficient documentation

## 2015-02-09 DIAGNOSIS — Y9289 Other specified places as the place of occurrence of the external cause: Secondary | ICD-10-CM | POA: Insufficient documentation

## 2015-02-09 DIAGNOSIS — Y9339 Activity, other involving climbing, rappelling and jumping off: Secondary | ICD-10-CM | POA: Insufficient documentation

## 2015-02-09 DIAGNOSIS — S93402A Sprain of unspecified ligament of left ankle, initial encounter: Secondary | ICD-10-CM | POA: Insufficient documentation

## 2015-02-09 DIAGNOSIS — W01198A Fall on same level from slipping, tripping and stumbling with subsequent striking against other object, initial encounter: Secondary | ICD-10-CM | POA: Insufficient documentation

## 2015-02-09 MED ORDER — OXYCODONE-ACETAMINOPHEN 5-325 MG PO TABS
1.0000 | ORAL_TABLET | Freq: Once | ORAL | Status: AC
Start: 2015-02-09 — End: 2015-02-09
  Administered 2015-02-09: 1 via ORAL

## 2015-02-09 MED ORDER — IBUPROFEN 200 MG PO TABS: 600.0000 mg | ORAL_TABLET | Freq: Four times a day (QID) | ORAL | Status: DC | PRN

## 2015-02-09 MED ORDER — OXYCODONE-ACETAMINOPHEN 5-325 MG PO TABS
ORAL_TABLET | ORAL | Status: AC
  Filled 2015-02-09: qty 1

## 2015-02-09 NOTE — ED Notes (Signed)
Left ankle pain since falling while trying to jump off the porch tonight. No bruising noted.  Very slight swelling noted.

## 2015-02-09 NOTE — ED Provider Notes (Signed)
[facility] Emergency Department Provider Note  ____________________________________________   I have reviewed the triage vital signs and the nursing notes.   HISTORY  Chief Complaint Ankle Pain    HPI Peter Buckley. is a 29 y.o. male presents today with ankle pain after jumping off a porch in an attempt to scare someone. He did not hit his head or fall, rolled the ankle. No prior injury to left ankle. Did break his right ankle years ago. No hip pain or knee pain no close head injury no fall no numbness no weakness. Pain is the lateral malleolus no other complaint or injury  History reviewed. No pertinent past medical history.  There are no active problems to display for this patient.   History reviewed. No pertinent past surgical history.  No current outpatient prescriptions on file.  Allergies Review of patient's allergies indicates no known allergies.  History reviewed. No pertinent family history.  Social History Social History  Substance Use Topics  . Smoking status: Never Smoker   . Smokeless tobacco: None  . Alcohol Use: No    Review of Systems   ____________________________________________   PHYSICAL EXAM:  VITAL SIGNS: ED Triage Vitals  Enc Vitals Group     BP 02/09/15 2000 155/74 mmHg     Pulse Rate 02/09/15 2000 94     Resp 02/09/15 2000 18     Temp 02/09/15 2000 98.5 F (36.9 C)     Temp Source 02/09/15 2000 Oral     SpO2 02/09/15 2000 99 %     Weight 02/09/15 2000 230 lb (104.327 kg)     Height 02/09/15 2000  (1.651 m)     Head Cir --      Peak Flow --      Pain Score 02/09/15 1959 10     Pain Loc --      Pain Edu? --      Excl. in GC? --     Constitutional: Alert and oriented. Well appearing and in no acute distress. Eyes: Conjunctivae are normal. PERRL. EOMI. Head: Atraumatic. Neck: No stridor.   Nontender with no meningismus Back:  There is no focal tenderness or step off there is no midline  tenderness there are no lesions noted. there is no CVA tenderness Musculoskeletal: No lower extremity tenderness. No joint effusions, no DVT signs strong distal pulses no edema  Left lower extremity: There is tenderness and mild swelling to the lateral aspect of the left ankle, distal to the malleolus. There is no foot pain there is no pain in or around the fifth metatarsal or anywhere else in the foot for that matter. There is minimal swelling, strong pulses compartment are soft, good cap refill, there is no laxity to varus and valgus stress, there is no knee pain tenderness or swelling, there is no hip tenderness to full range of motion or palpation, compartments are soft in the entire leg Neurologic:  Normal speech and language. No gross focal neurologic deficits are appreciated.  Skin:  Skin is warm, dry and intact. No rash noted. Psychiatric: Mood and affect are normal. Speech and behavior are normal.  ____________________________________________   LABS (all labs ordered are listed, but only abnormal results are displayed)  Labs Reviewed - No data to display ____________________________________________  EKG  I personally interpreted any EKGs ordered by me or triage  ____________________________________________  RADIOLOGY  I reviewed any imaging ordered by me or triage that were performed during my shift ____________________________________________  PROCEDURES  Procedure(s) performed: None  Critical Care performed: None  ____________________________________________   INITIAL IMPRESSION / ASSESSMENT AND PLAN / ED COURSE  Pertinent labs & imaging results that were available during my care of the patient were reviewed by me and considered in my medical decision making (see chart for details).  Most consistent with a likely ankle sprain although fracture cannot be determined really rule out, most of the pain is actually in the ligamentous region and not bony tenderness.  X-ray is pending, if negative as in to this. Patient likely will require cast, crutches, and follow up as needed with orthopedic.  ____________________________________________   FINAL CLINICAL IMPRESSION(S) / ED DIAGNOSES  Final diagnoses:  None     Jeanmarie PlantJames A McShane, MD 02/09/15 2028

## 2015-02-09 NOTE — Discharge Instructions (Signed)
Ankle Sprain  An ankle sprain is an injury to the strong, fibrous tissues (ligaments) that hold the bones of your ankle joint together.   CAUSES  An ankle sprain is usually caused by a fall or by twisting your ankle. Ankle sprains most commonly occur when you step on the outer edge of your foot, and your ankle turns inward. People who participate in sports are more prone to these types of injuries.   SYMPTOMS    Pain in your ankle. The pain may be present at rest or only when you are trying to stand or walk.   Swelling.   Bruising. Bruising may develop immediately or within 1 to 2 days after your injury.   Difficulty standing or walking, particularly when turning corners or changing directions.  DIAGNOSIS   Your caregiver will ask you details about your injury and perform a physical exam of your ankle to determine if you have an ankle sprain. During the physical exam, your caregiver will press on and apply pressure to specific areas of your foot and ankle. Your caregiver will try to move your ankle in certain ways. An X-ray exam may be done to be sure a bone was not broken or a ligament did not separate from one of the bones in your ankle (avulsion fracture).   TREATMENT   Certain types of braces can help stabilize your ankle. Your caregiver can make a recommendation for this. Your caregiver may recommend the use of medicine for pain. If your sprain is severe, your caregiver may refer you to a surgeon who helps to restore function to parts of your skeletal system (orthopedist) or a physical therapist.  HOME CARE INSTRUCTIONS    Apply ice to your injury for 1-2 days or as directed by your caregiver. Applying ice helps to reduce inflammation and pain.    Put ice in a plastic bag.    Place a towel between your skin and the bag.    Leave the ice on for 15-20 minutes at a time, every 2 hours while you are awake.   Only take over-the-counter or prescription medicines for pain, discomfort, or fever as directed by  your caregiver.   Elevate your injured ankle above the level of your heart as much as possible for 2-3 days.   If your caregiver recommends crutches, use them as instructed. Gradually put weight on the affected ankle. Continue to use crutches or a cane until you can walk without feeling pain in your ankle.   If you have a plaster splint, wear the splint as directed by your caregiver. Do not rest it on anything harder than a pillow for the first 24 hours. Do not put weight on it. Do not get it wet. You may take it off to take a shower or bath.   You may have been given an elastic bandage to wear around your ankle to provide support. If the elastic bandage is too tight (you have numbness or tingling in your foot or your foot becomes cold and blue), adjust the bandage to make it comfortable.   If you have an air splint, you may blow more air into it or let air out to make it more comfortable. You may take your splint off at night and before taking a shower or bath. Wiggle your toes in the splint several times per day to decrease swelling.  SEEK MEDICAL CARE IF:    You have rapidly increasing bruising or swelling.   Your toes feel   extremely cold or you lose feeling in your foot.   Your pain is not relieved with medicine.  SEEK IMMEDIATE MEDICAL CARE IF:   Your toes are numb or blue.   You have severe pain that is increasing.  MAKE SURE YOU:    Understand these instructions.   Will watch your condition.   Will get help right away if you are not doing well or get worse.     This information is not intended to replace advice given to you by your health care provider. Make sure you discuss any questions you have with your health care provider.     Document Released: 03/17/2005 Document Revised: 04/07/2014 Document Reviewed: 03/29/2011  Elsevier Interactive Patient Education 2016 [employer]

## 2015-02-09 NOTE — ED Notes (Signed)
Pt dc home with spouse denies pain instructed on follow up plan and med use. PT NAD AT DC

## 2015-02-09 NOTE — ED Notes (Signed)
Pt refused crutches has some at home, order cancelled

## 2015-03-14 ENCOUNTER — Emergency Department
Admission: EM | Admit: 2015-03-14 | Discharge: 2015-03-15 | Disposition: A | Discharge status: Home / Self Care | Payer: Self-pay | Attending: Emergency Medicine | Admitting: Emergency Medicine

## 2015-03-14 ENCOUNTER — Encounter: Payer: Self-pay | Admitting: Emergency Medicine

## 2015-03-14 DIAGNOSIS — E119 Type 2 diabetes mellitus without complications: Secondary | ICD-10-CM | POA: Insufficient documentation

## 2015-03-14 DIAGNOSIS — I10 Essential (primary) hypertension: Secondary | ICD-10-CM | POA: Insufficient documentation

## 2015-03-14 LAB — BASIC METABOLIC PANEL
Anion gap: 11 (ref 5–15)
BUN: 17 mg/dL (ref 6–20)
CO2: 26 mmol/L (ref 22–32)
Calcium: 9.7 mg/dL (ref 8.9–10.3)
Chloride: 94 mmol/L — ABNORMAL LOW (ref 101–111)
Creatinine, Ser: 0.93 mg/dL (ref 0.61–1.24)
GFR calc Af Amer: >60 mL/min (ref >60)
GLUCOSE: 511 mg/dL — AB (ref 65–99)
Potassium: 4.2 mmol/L (ref 3.5–5.1)
SODIUM: 131 mmol/L — AB (ref 135–145)

## 2015-03-14 LAB — CBC WITH DIFFERENTIAL/PLATELET
BASOS ABS: 0.1 10*3/uL (ref 0–0.1)
Basophils Relative: 1 %
EOS PCT: 1 %
Eosinophils Absolute: 0.1 10*3/uL (ref 0–0.7)
HCT: 45.9 % (ref 40.0–52.0)
Hemoglobin: 16 g/dL (ref 13.0–18.0)
LYMPHS ABS: 2.8 10*3/uL (ref 1.0–3.6)
LYMPHS PCT: 34 %
MCH: 29 pg (ref 26.0–34.0)
MCHC: 34.7 g/dL (ref 32.0–36.0)
MCV: 83.5 fL (ref 80.0–100.0)
Monocytes Absolute: 0.6 10*3/uL (ref 0.2–1.0)
Monocytes Relative: 8 %
NEUTROS ABS: 4.6 10*3/uL (ref 1.4–6.5)
Neutrophils Relative %: 56 %
PLATELETS: 203 10*3/uL (ref 150–440)
RBC: 5.5 MIL/uL (ref 4.40–5.90)
RDW: 12.2 % (ref 11.5–14.5)
WBC: 8.2 10*3/uL (ref 3.8–10.6)

## 2015-03-14 LAB — URINALYSIS COMPLETE WITH MICROSCOPIC (ARMC ONLY)
BILIRUBIN URINE: NEGATIVE
Bacteria, UA: NONE SEEN
Glucose, UA: >500 mg/dL — AB
Hgb urine dipstick: NEGATIVE
KETONES UR: NEGATIVE mg/dL
LEUKOCYTES UA: NEGATIVE
Nitrite: NEGATIVE
PH: 6 (ref 5.0–8.0)
Protein, ur: NEGATIVE mg/dL
Specific Gravity, Urine: 1.033 — ABNORMAL HIGH (ref 1.005–1.030)

## 2015-03-14 MED ORDER — SODIUM CHLORIDE 0.9 % IV BOLUS (SEPSIS)
1000.0000 mL | Freq: Once | INTRAVENOUS | Status: AC
  Administered 2015-03-15: 1000 mL via INTRAVENOUS

## 2015-03-14 MED ORDER — SODIUM CHLORIDE 0.9 % IV BOLUS (SEPSIS)
1000.0000 mL | Freq: Once | INTRAVENOUS | Status: AC
  Administered 2015-03-14: 1000 mL via INTRAVENOUS

## 2015-03-14 NOTE — ED Provider Notes (Signed)
[facility] Emergency Department Provider Note  ____________________________________________  Time seen: 11:00 PM  I have reviewed the triage vital signs and the nursing notes.   HISTORY  Chief Complaint Hypertension and Headache      HPI Peter JumboLarry D Raia Jr. is a 29 y.o. male presents with history of hypertension as well as concerns regarding diabetes. Patient has had polyuria polydipsia and polyphagia with a family history of diabetes. Patient states that he struck his blood pressure repetitively and it's always elevated on presentation to emergency department pressure was 192/88.     There are no active problems to display for this patient.   Past Surgical history   Current Outpatient Rx  Name  Route  Sig  Dispense  Refill  . ibuprofen (MOTRIN IB) 200 MG tablet   Oral   Take 3 tablets (600 mg total) by mouth every 6 (six) hours as needed.   20 tablet   0     Allergies Review of patient's allergies indicates no known allergies.  History reviewed. No pertinent family history.  Social History Social History  Substance Use Topics  . Smoking status: Never Smoker   . Smokeless tobacco: None  . Alcohol Use: No    Review of Systems  Constitutional: Negative for fever. Eyes: Negative for visual changes. ENT: Negative for sore throat. Cardiovascular: Negative for chest pain. Respiratory: Negative for shortness of breath. Gastrointestinal: Negative for abdominal pain, vomiting and diarrhea. Genitourinary: Negative for dysuria. Musculoskeletal: Negative for back pain. Skin: Negative for rash. Neurological: Negative for headaches, focal weakness or numbness.   10-point ROS otherwise negative.  ____________________________________________   PHYSICAL EXAM:  VITAL SIGNS: ED Triage Vitals  Enc Vitals Group     BP 03/14/15 2059 192/88 mmHg     Pulse Rate 03/14/15 2059 101     Resp 03/14/15 2059 18     Temp 03/14/15 2059 98.2 F  (36.8 C)     Temp src --      SpO2 03/14/15 2059 98 %     Weight 03/14/15 2059 235 lb (106.595 kg)     Height 03/14/15 2059 5\' 7"  (1.702 m)     Head Cir --      Peak Flow --      Pain Score 03/14/15 2100 6     Pain Loc --      Pain Edu? --      Excl. in GC? --      Constitutional: Alert and oriented. Well appearing and in no distress. Eyes: Conjunctivae are normal. PERRL. Normal extraocular movements. ENT   Head: Normocephalic and atraumatic.   Nose: No congestion/rhinnorhea.   Mouth/Throat: Mucous membranes are moist.   Neck: No stridor. Hematological/Lymphatic/Immunilogical: No cervical lymphadenopathy. Cardiovascular: Normal rate, regular rhythm. Normal and symmetric distal pulses are present in all extremities. No murmurs, rubs, or gallops. Respiratory: Normal respiratory effort without tachypnea nor retractions. Breath sounds are clear and equal bilaterally. No wheezes/rales/rhonchi. Gastrointestinal: Soft and nontender. No distention. There is no CVA tenderness. Genitourinary: deferred Musculoskeletal: Nontender with normal range of motion in all extremities. No joint effusions.  No lower extremity tenderness nor edema. Neurologic:  Normal speech and language. No gross focal neurologic deficits are appreciated. Speech is normal.  Skin:  Skin is warm, dry and intact. No rash noted. Psychiatric: Mood and affect are normal. Speech and behavior are normal. Patient exhibits appropriate insight and judgment.  ____________________________________________    LABS (pertinent positives/negatives)  Labs Reviewed  BASIC METABOLIC PANEL -  Abnormal; Notable for the following:    Sodium 131 (*)    Chloride 94 (*)    Glucose, Bld 511 (*)    All other components within normal limits  URINALYSIS COMPLETEWITH MICROSCOPIC ([facility] ONLY) - Abnormal; Notable for the following:    Color, Urine STRAW (*)    APPearance CLEAR (*)    Glucose, UA >500 (*)    Specific Gravity, Urine  1.033 (*)    Squamous Epithelial / LPF 0-5 (*)    All other components within normal limits  GLUCOSE, CAPILLARY - Abnormal; Notable for the following:    Glucose-Capillary 404 (*)    All other components within normal limits  GLUCOSE, CAPILLARY - Abnormal; Notable for the following:    Glucose-Capillary 316 (*)    All other components within normal limits  CBC WITH DIFFERENTIAL/PLATELET     ____________________________________________   EKG  ED ECG REPORT I, Khylin Gutridge, Pastura N, the attending physician, personally viewed and interpreted this ECG.   Date: 03/15/2015  EKG Time: 10:44 PM  Rate: 85  Rhythm: Normal sinus rhythm  Axis: None  Intervals: Normal  ST&T Change: None     INITIAL IMPRESSION / ASSESSMENT AND PLAN / ED COURSE  Pertinent labs & imaging results that were available during my care of the patient were reviewed by me and considered in my medical decision making (see chart for details).  History physical exam laboratory data consistent with new onset type 2 diabetes mellitus. Patient received 2 L normal saline as well as subcutaneous insulin 6 units. Repeat glucose 316 as such patient received metformin 500 mg was prescribed the same at home twice a day as well as referred to outpatient primary care  ____________________________________________   FINAL CLINICAL IMPRESSION(S) / ED DIAGNOSES  Final diagnoses:  Essential hypertension  New onset type 2 diabetes mellitus (HCC)      Darci Current, MD 03/15/15 [phone]

## 2015-03-14 NOTE — ED Notes (Signed)
Dr. Manson PasseyBrown in room to update pt on critical glucose lab value.

## 2015-03-14 NOTE — ED Notes (Signed)
Dr. Brown at bedside

## 2015-03-14 NOTE — ED Notes (Signed)
Pt arrived to the ED for complaints of having "high blood pressure." Pt reports that he has been experiencing headache, feeling warm (face on fire) and thirsty related to his high blood pressure. Pt states that hypertension runs in his family and he knows that he has it and its time to get it fixed. Pt is AOx4 in no apparent distress.

## 2015-03-15 LAB — GLUCOSE, CAPILLARY
Glucose-Capillary: 404 mg/dL — ABNORMAL HIGH (ref 65–99)
Glucose-Capillary: 316 mg/dL — ABNORMAL HIGH (ref 65–99)

## 2015-03-15 MED ORDER — METFORMIN HCL 500 MG PO TABS
500.0000 mg | ORAL_TABLET | Freq: Once | ORAL | Status: AC
  Administered 2015-03-15: 500 mg via ORAL
  Filled 2015-03-15: qty 1

## 2015-03-15 MED ORDER — HYDROCHLOROTHIAZIDE 25 MG PO TABS
ORAL_TABLET | ORAL | Status: AC
  Administered 2015-03-15: 25 mg via ORAL
  Filled 2015-03-15: qty 1

## 2015-03-15 MED ORDER — INSULIN ASPART 100 UNIT/ML ~~LOC~~ SOLN
6.0000 [IU] | Freq: Once | SUBCUTANEOUS | Status: AC
  Administered 2015-03-15: 6 [IU] via SUBCUTANEOUS
  Filled 2015-03-15: qty 6

## 2015-03-15 MED ORDER — METFORMIN HCL 500 MG PO TABS
500.0000 mg | ORAL_TABLET | Freq: Two times a day (BID) | ORAL | Status: DC
Start: 2015-03-15 — End: 2017-09-21

## 2015-03-15 MED ORDER — HYDROCHLOROTHIAZIDE 25 MG PO TABS
25.0000 mg | ORAL_TABLET | Freq: Every day | ORAL | Status: DC
  Administered 2015-03-15: 25 mg via ORAL

## 2015-03-15 MED ORDER — HYDROCHLOROTHIAZIDE 25 MG PO TABS: 25.0000 mg | ORAL_TABLET | Freq: Every day | ORAL | Status: DC

## 2015-03-15 NOTE — Discharge Instructions (Signed)
Blood Glucose Monitoring, Adult Monitoring your blood glucose (also know as blood sugar) helps you to manage your diabetes. It also helps you and your health care provider monitor your diabetes and determine how well your treatment plan is working. WHY SHOULD YOU MONITOR YOUR BLOOD GLUCOSE?  It can help you understand how food, exercise, and medicine affect your blood glucose.  It allows you to know what your blood glucose is at any given moment. You can quickly tell if you are having low blood glucose (hypoglycemia) or high blood glucose (hyperglycemia).  It can help you and your health care provider know how to adjust your medicines.  It can help you understand how to manage an illness or adjust medicine for exercise. WHEN SHOULD YOU TEST? Your health care provider will help you decide how often you should check your blood glucose. This may depend on the type of diabetes you have, your diabetes control, or the types of medicines you are taking. Be sure to write down all of your blood glucose readings so that this information can be reviewed with your health care provider. See below for examples of testing times that your health care provider may suggest. Type 1 Diabetes  Test at least 2 times per day if your diabetes is well controlled, if you are using an insulin pump, or if you perform multiple daily injections.  If your diabetes is not well controlled or if you are sick, you may need to test more often.  It is a good idea to also test:  Before every insulin injection.  Before and after exercise.  Between meals and 2 hours after a meal.  Occasionally between 2:00 a.m. and 3:00 a.m. Type 2 Diabetes  If you are taking insulin, test at least 2 times per day. However, it is best to test before every insulin injection.  If you take medicines by mouth (orally), test 2 times a day.  If you are on a controlled diet, test once a day.  If your diabetes is not well controlled or if you  are sick, you may need to monitor more often. HOW TO MONITOR YOUR BLOOD GLUCOSE Supplies Needed  Blood glucose meter.  Test strips for your meter. Each meter has its own strips. You must use the strips that go with your own meter.  A pricking needle (lancet).  A device that holds the lancet (lancing device).  A journal or log book to write down your results. Procedure  Wash your hands with soap and water. Alcohol is not preferred.  Prick the side of your finger (not the tip) with the lancet.  Gently milk the finger until a small drop of blood appears.  Follow the instructions that come with your meter for inserting the test strip, applying blood to the strip, and using your blood glucose meter. Other Areas to Get Blood for Testing Some meters allow you to use other areas of your body (other than your finger) to test your blood. These areas are called alternative sites. The most common alternative sites are:  The forearm.  The thigh.  The back area of the lower leg.  The palm of the hand. The blood flow in these areas is slower. Therefore, the blood glucose values you get may be delayed, and the numbers are different from what you would get from your fingers. Do not use alternative sites if you think you are having hypoglycemia. Your reading will not be accurate. Always use a finger if you are  having hypoglycemia. Also, if you cannot feel your lows (hypoglycemia unawareness), always use your fingers for your blood glucose checks. ADDITIONAL TIPS FOR GLUCOSE MONITORING  Do not reuse lancets.  Always carry your supplies with you.  All blood glucose meters have a 24-hour "hotline" number to call if you have questions or need help.  Adjust (calibrate) your blood glucose meter with a control solution after finishing a few boxes of strips. BLOOD GLUCOSE RECORD KEEPING It is a good idea to keep a daily record or log of your blood glucose readings. Most glucose meters, if not all,  keep your glucose records stored in the meter. Some meters come with the ability to download your records to your home computer. Keeping a record of your blood glucose readings is especially helpful if you are wanting to look for patterns. Make notes to go along with the blood glucose readings because you might forget what happened at that exact time. Keeping good records helps you and your health care provider to work together to achieve good diabetes management.    This information is not intended to replace advice given to you by your health care provider. Make sure you discuss any questions you have with your health care provider.   Document Released: 03/20/2003 Document Revised: 04/07/2014 Document Reviewed: 08/09/2012 Elsevier Interactive Patient Education 2016 [employer]  Type 2 Diabetes Mellitus, Adult Type 2 diabetes mellitus, often simply referred to as type 2 diabetes, is a long-lasting (chronic) disease. In type 2 diabetes, the pancreas does not make enough insulin (a hormone), the cells are less responsive to the insulin that is made (insulin resistance), or both. Normally, insulin moves sugars from food into the tissue cells. The tissue cells use the sugars for energy. The lack of insulin or the lack of normal response to insulin causes excess sugars to build up in the blood instead of going into the tissue cells. As a result, high blood sugar (hyperglycemia) develops. The effect of high sugar (glucose) levels can cause many complications. Type 2 diabetes was also previously called adult-onset diabetes, but it can occur at any age.  RISK FACTORS  A person is predisposed to developing type 2 diabetes if someone in the family has the disease and also has one or more of the following primary risk factors:  Weight gain, or being overweight or obese.  An inactive lifestyle.  A history of consistently eating high-calorie foods. Maintaining a normal weight and regular physical  activity can reduce the chance of developing type 2 diabetes. SYMPTOMS  A person with type 2 diabetes may not show symptoms initially. The symptoms of type 2 diabetes appear slowly. The symptoms include:  Increased thirst (polydipsia).  Increased urination (polyuria).  Increased urination during the night (nocturia).  Sudden or unexplained weight changes.  Frequent, recurring infections.  Tiredness (fatigue).  Weakness.  Vision changes, such as blurred vision.  Fruity smell to your breath.  Abdominal pain.  Nausea or vomiting.  Cuts or bruises which are slow to heal.  Tingling or numbness in the hands or feet.  An open skin wound (ulcer). DIAGNOSIS Type 2 diabetes is frequently not diagnosed until complications of diabetes are present. Type 2 diabetes is diagnosed when symptoms or complications are present and when blood glucose levels are increased. Your blood glucose level may be checked by one or more of the following blood tests:  A fasting blood glucose test. You will not be allowed to eat for at least 8 hours before a  blood sample is taken.  A random blood glucose test. Your blood glucose is checked at any time of the day regardless of when you ate.  A hemoglobin A1c blood glucose test. A hemoglobin A1c test provides information about blood glucose control over the previous 3 months.  An oral glucose tolerance test (OGTT). Your blood glucose is measured after you have not eaten (fasted) for 2 hours and then after you drink a glucose-containing beverage. TREATMENT   You may need to take insulin or diabetes medicine daily to keep blood glucose levels in the desired range.  If you use insulin, you may need to adjust the dosage depending on the carbohydrates that you eat with each meal or snack.  Lifestyle changes are recommended as part of your treatment. These may include:  Following an individualized diet plan developed by a nutritionist or  dietitian.  Exercising daily. Your health care providers will set individualized treatment goals for you based on your age, your medicines, how long you have had diabetes, and any other medical conditions you have. Generally, the goal of treatment is to maintain the following blood glucose levels:  Before meals (preprandial): 80-130 mg/dL.  After meals (postprandial): below 180 mg/dL.  A1c: less than 6.5-7%. HOME CARE INSTRUCTIONS   Have your hemoglobin A1c level checked twice a year.  Perform daily blood glucose monitoring as directed by your health care provider.  Monitor urine ketones when you are ill and as directed by your health care provider.  Take your diabetes medicine or insulin as directed by your health care provider to maintain your blood glucose levels in the desired range.  Never run out of diabetes medicine or insulin. It is needed every day.  If you are using insulin, you may need to adjust the amount of insulin given based on your intake of carbohydrates. Carbohydrates can raise blood glucose levels but need to be included in your diet. Carbohydrates provide vitamins, minerals, and fiber which are an essential part of a healthy diet. Carbohydrates are found in fruits, vegetables, whole grains, dairy products, legumes, and foods containing added sugars.  Eat healthy foods. You should make an appointment to see a registered dietitian to help you create an eating plan that is right for you.  Lose weight if you are overweight.  Carry a medical alert card or wear your medical alert jewelry.  Carry a 15-gram carbohydrate snack with you at all times to treat low blood glucose (hypoglycemia). Some examples of 15-gram carbohydrate snacks include:  Glucose tablets, 3 or 4.  Glucose gel, 15-gram tube.  Raisins, 2 tablespoons (24 grams).  Jelly beans, 6.  Animal crackers, 8.  Regular pop, 4 ounces (120 mL).  Gummy treats, 9.  Recognize hypoglycemia. Hypoglycemia  occurs with blood glucose levels of 70 mg/dL and below. The risk for hypoglycemia increases when fasting or skipping meals, during or after intense exercise, and during sleep. Hypoglycemia symptoms can include:  Tremors or shakes.  Decreased ability to concentrate.  Sweating.  Increased heart rate.  Headache.  Dry mouth.  Hunger.  Irritability.  Anxiety.  Restless sleep.  Altered speech or coordination.  Confusion.  Treat hypoglycemia promptly. If you are alert and able to safely swallow, follow the 15:15 rule:  Take 15-20 grams of rapid-acting glucose or carbohydrate. Rapid-acting options include glucose gel, glucose tablets, or 4 ounces (120 mL) of fruit juice, regular soda, or low-fat milk.  Check your blood glucose level 15 minutes after taking the glucose.  Take 15-20  grams more of glucose if the repeat blood glucose level is still 70 mg/dL or below.  Eat a meal or snack within 1 hour once blood glucose levels return to normal.  Be alert to feeling very thirsty and urinating more frequently than usual, which are early signs of hyperglycemia. An early awareness of hyperglycemia allows for prompt treatment. Treat hyperglycemia as directed by your health care provider.  Engage in at least 150 minutes of moderate-intensity physical activity a week, spread over at least 3 days of the week or as directed by your health care provider. In addition, you should engage in resistance exercise at least 2 times a week or as directed by your health care provider. Try to spend no more than 90 minutes at one time inactive.  Adjust your medicine and food intake as needed if you start a new exercise or sport.  Follow your sick-day plan anytime you are unable to eat or drink as usual.  Do not use any tobacco products including cigarettes, chewing tobacco, or electronic cigarettes. If you need help quitting, ask your health care provider.  Limit alcohol intake to no more than 1 drink  per day for nonpregnant women and 2 drinks per day for men. You should drink alcohol only when you are also eating food. Talk with your health care provider whether alcohol is safe for you. Tell your health care provider if you drink alcohol several times a week.  Keep all follow-up visits as directed by your health care provider. This is important.  Schedule an eye exam soon after the diagnosis of type 2 diabetes and then annually.  Perform daily skin and foot care. Examine your skin and feet daily for cuts, bruises, redness, nail problems, bleeding, blisters, or sores. A foot exam by a health care provider should be done annually.  Brush your teeth and gums at least twice a day and floss at least once a day. Follow up with your dentist regularly.  Share your diabetes management plan with your workplace or school.  Keep your immunizations up to date. It is recommended that you receive a flu (influenza) vaccine every year. It is also recommended that you receive a pneumonia (pneumococcal) vaccine. If you are 50 years of age or older and have never received a pneumonia vaccine, this vaccine may be given as a series of two separate shots. Ask your health care provider which additional vaccines may be recommended.  Learn to manage stress.  Obtain ongoing diabetes education and support as needed.  Participate in or seek rehabilitation as needed to maintain or improve independence and quality of life. Request a physical or occupational therapy referral if you are having foot or hand numbness, or difficulties with grooming, dressing, eating, or physical activity. SEEK MEDICAL CARE IF:   You are unable to eat food or drink fluids for more than 6 hours.  You have nausea and vomiting for more than 6 hours.  Your blood glucose level is over 240 mg/dL.  There is a change in mental status.  You develop an additional serious illness.  You have diarrhea for more than 6 hours.  You have been sick  or have had a fever for a couple of days and are not getting better.  You have pain during any physical activity.  SEEK IMMEDIATE MEDICAL CARE IF:  You have difficulty breathing.  You have moderate to large ketone levels.   This information is not intended to replace advice given to you by your  health care provider. Make sure you discuss any questions you have with your health care provider.   Document Released: 03/17/2005 Document Revised: 12/06/2014 Document Reviewed: 10/14/2011 Elsevier Interactive Patient Education 2016 [employer].  Hypertension Hypertension, commonly called high blood pressure, is when the force of blood pumping through your arteries is too strong. Your arteries are the blood vessels that carry blood from your heart throughout your body. A blood pressure reading consists of a higher number over a lower number, such as 110/72. The higher number (systolic) is the pressure inside your arteries when your heart pumps. The lower number (diastolic) is the pressure inside your arteries when your heart relaxes. Ideally you want your blood pressure below 120/80. Hypertension forces your heart to work harder to pump blood. Your arteries may become narrow or stiff. Having untreated or uncontrolled hypertension can cause heart attack, stroke, kidney disease, and other problems. RISK FACTORS Some risk factors for high blood pressure are controllable. Others are not.  Risk factors you cannot control include:   Race. You may be at higher risk if you are African American.  Age. Risk increases with age.  Gender. Men are at higher risk than women before age 56 years. After age 48, women are at higher risk than men. Risk factors you can control include:  Not getting enough exercise or physical activity.  Being overweight.  Getting too much fat, sugar, calories, or salt in your diet.  Drinking too much alcohol. SIGNS AND SYMPTOMS Hypertension does not usually cause signs or  symptoms. Extremely high blood pressure (hypertensive crisis) may cause headache, anxiety, shortness of breath, and nosebleed. DIAGNOSIS To check if you have hypertension, your health care provider will measure your blood pressure while you are seated, with your arm held at the level of your heart. It should be measured at least twice using the same arm. Certain conditions can cause a difference in blood pressure between your right and left arms. A blood pressure reading that is higher than normal on one occasion does not mean that you need treatment. If it is not clear whether you have high blood pressure, you may be asked to return on a different day to have your blood pressure checked again. Or, you may be asked to monitor your blood pressure at home for 1 or more weeks. TREATMENT Treating high blood pressure includes making lifestyle changes and possibly taking medicine. Living a healthy lifestyle can help lower high blood pressure. You may need to change some of your habits. Lifestyle changes may include:  Following the DASH diet. This diet is high in fruits, vegetables, and whole grains. It is low in salt, red meat, and added sugars.  Keep your sodium intake below 2,300 mg per day.  Getting at least 30-45 minutes of aerobic exercise at least 4 times per week.  Losing weight if necessary.  Not smoking.  Limiting alcoholic beverages.  Learning ways to reduce stress. Your health care provider may prescribe medicine if lifestyle changes are not enough to get your blood pressure under control, and if one of the following is true:  You are 80-68 years of age and your systolic blood pressure is above 140.  You are 39 years of age or older, and your systolic blood pressure is above 150.  Your diastolic blood pressure is above 90.  You have diabetes, and your systolic blood pressure is over 140 or your diastolic blood pressure is over 90.  You have kidney disease and your blood pressure  is  above 140/90.  You have heart disease and your blood pressure is above 140/90. Your personal target blood pressure may vary depending on your medical conditions, your age, and other factors. HOME CARE INSTRUCTIONS  Have your blood pressure rechecked as directed by your health care provider.   Take medicines only as directed by your health care provider. Follow the directions carefully. Blood pressure medicines must be taken as prescribed. The medicine does not work as well when you skip doses. Skipping doses also puts you at risk for problems.  Do not smoke.   Monitor your blood pressure at home as directed by your health care provider. SEEK MEDICAL CARE IF:   You think you are having a reaction to medicines taken.  You have recurrent headaches or feel dizzy.  You have swelling in your ankles.  You have trouble with your vision. SEEK IMMEDIATE MEDICAL CARE IF:  You develop a severe headache or confusion.  You have unusual weakness, numbness, or feel faint.  You have severe chest or abdominal pain.  You vomit repeatedly.  You have trouble breathing. MAKE SURE YOU:   Understand these instructions.  Will watch your condition.  Will get help right away if you are not doing well or get worse.   This information is not intended to replace advice given to you by your health care provider. Make sure you discuss any questions you have with your health care provider.   Document Released: 03/17/2005 Document Revised: 08/01/2014 Document Reviewed: 01/07/2013 Elsevier Interactive Patient Education [employer]

## 2016-03-13 ENCOUNTER — Emergency Department
Admission: EM | Admit: 2016-03-13 | Discharge: 2016-03-13 | Disposition: A | Discharge status: Home / Self Care | Payer: Self-pay | Attending: Emergency Medicine | Admitting: Emergency Medicine

## 2016-03-13 ENCOUNTER — Encounter: Payer: Self-pay | Admitting: Urgent Care

## 2016-03-13 DIAGNOSIS — K0889 Other specified disorders of teeth and supporting structures: Secondary | ICD-10-CM | POA: Insufficient documentation

## 2016-03-13 MED ORDER — NAPROXEN 500 MG PO TABS
500.0000 mg | ORAL_TABLET | Freq: Once | ORAL | Status: AC
  Administered 2016-03-13: 500 mg via ORAL
  Filled 2016-03-13: qty 1

## 2016-03-13 MED ORDER — AMOXICILLIN 500 MG PO TABS: 500.0000 mg | ORAL_TABLET | Freq: Three times a day (TID) | ORAL | 0 refills | Status: DC

## 2016-03-13 MED ORDER — AMOXICILLIN 500 MG PO CAPS
500.0000 mg | ORAL_CAPSULE | Freq: Once | ORAL | Status: AC
  Administered 2016-03-13: 500 mg via ORAL
  Filled 2016-03-13: qty 1

## 2016-03-13 MED ORDER — TRAMADOL HCL 50 MG PO TABS: 50.0000 mg | ORAL_TABLET | Freq: Four times a day (QID) | ORAL | 0 refills | Status: DC | PRN

## 2016-03-13 NOTE — Discharge Instructions (Signed)
Please call and schedule a dental appointment as soon as possible. You will need to be seen within the next 14 days. Return to the emergency department for symptoms that change or worsen if you're unable to schedule an appointment.  OPTIONS FOR DENTAL FOLLOW UP CARE  [place] Department of Health and Human Services - Local Safety Net Dental Clinics http://www.ncdhhs.gov/dph/oralhealth/services/safetynetclinics.htm   [facility] ([phone])  [facility] ([phone])  [facility] ([phone])  [facility] ([phone])  [facility] ([phone]) This clinic caters to the indigent population and is on a lottery system. Location: [facility], [address], [place] Clinic Hours: Wednesdays from 6pm - 9pm, patients seen by a lottery system. For dates, call or go to [email or web address].[email or web address]/[facility]/[email or web address] Services: Cleanings, fillings and simple extractions. Payment Options: DENTAL WORK IS FREE OF CHARGE. Bring proof of income or support. Best way to get seen: Arrive at 5:15 pm - this is a lottery, NOT first come/first serve, so arriving earlier will not increase your chances of being seen.     [facility] [phone] Select option 1 for emergencies   Location: [facility], [address], [place] Clinic Hours: No walk-ins accepted - call the day before to schedule an appointment. Check in times are 9:30 am and 1:30 pm. Services: Simple extractions, temporary fillings, pulpectomy/pulp debridement, uncomplicated abscess drainage. Payment Options: PAYMENT IS DUE AT THE TIME OF SERVICE.  Fee is usually $100-200, additional surgical procedures (e.g. abscess drainage) may be extra. Cash, checks, Visa/MasterCard accepted.  Can file Medicaid if patient is covered for dental - patient should call case worker to check. No  discount for [facility] patients. Best way to get seen: MUST call the day before and get onto the schedule. Can usually be seen the next 1-2 days. No walk-ins accepted.     [facility] [phone]   Location: [facility], [address], [place] [facility] Hours: M, W, Th, F 8am or 1:30pm, Tues 9a or 1:30 - first come/first served. Services: Simple extractions, temporary fillings, uncomplicated abscess drainage.  You do not need to be an [place] resident. Payment Options: PAYMENT IS DUE AT THE TIME OF SERVICE. Dental insurance, otherwise sliding scale - bring proof of income or support. Depending on income and treatment needed, cost is usually $50-200. Best way to get seen: Arrive early as it is first come/first served.     [facility] [phone]   Location: [address] Clinic Hours: Mon-Thu 8a-5p Services: Most basic dental services including extractions and fillings. Payment Options: PAYMENT IS DUE AT THE TIME OF SERVICE. Sliding scale, up to 50% off - bring proof if income or support. Medicaid with dental option accepted. Best way to get seen: Call to schedule an appointment, can usually be seen within 2 weeks OR they will try to see walk-ins - show up at 8a or 2p (you may have to wait).     [facility] [phone] [place] RESIDENTS ONLY   Location: [facility], [address], [place], [place] [address] Clinic Hours: By appointment only. Monday - Thursday 8am-5pm, Friday 8am-12pm Services: Cleanings, fillings, extractions. Payment Options: PAYMENT IS DUE AT THE TIME OF SERVICE. Cash, Visa or MasterCard. Sliding scale - $30 minimum per service. Best way to get seen: Come in to office, complete packet and make an appointment -   need proof of income or support monies for each household member and proof of [place]  residence. Usually takes about a month to get in.     [facility] [phone]   Location: [address] [place] Clinic Hours: Walk-in Urgent Care Dental Services are offered Monday-Friday mornings only. The numbers of emergencies accepted daily is limited to the number of providers available. Maximum 15 - Mondays, Wednesdays & Thursdays Maximum 10 - Tuesdays & Fridays Services: You do not need to be a [place] resident to be seen for a dental emergency. Emergencies are defined as pain, swelling, abnormal bleeding, or dental trauma. Walkins will receive x-rays if needed. NOTE: Dental cleaning is not an emergency. Payment Options: PAYMENT IS DUE AT THE TIME OF SERVICE. Minimum co-pay is $40.00 for uninsured patients. Minimum co-pay is $3.00 for Medicaid with dental coverage. Dental Insurance is accepted and must be presented at time of visit. Medicare does not cover dental. Forms of payment: Cash, credit card, checks. Best way to get seen: If not previously registered with the clinic, walk-in dental registration begins at 7:15 am and is on a first come/first serve basis. If previously registered with the clinic, call to make an appointment.     [facility] [phone] [place] RESIDENTS ONLY   Location: [address], [place], [place] Clinic Hours: Mon-Thu 10a-2p Services: Extractions only! Payment Options: FREE (donations accepted) - bring proof of income or support Best way to get seen: Call and schedule an appointment OR come at 8am on the 1st Monday of every month (except for holidays) when it is first come/first served.     [facility] [phone]   Location: [address], [place] Clinic Hours: Friday mornings Services, Payment Options, Best way to get seen: Call for info  

## 2016-03-13 NOTE — ED Provider Notes (Signed)
[facility] Emergency Department Provider Note ____________________________________________  Time seen: Approximately 10:05 PM  I have reviewed the triage vital signs and the nursing notes.   HISTORY  Chief Complaint Dental Pain   HPI Peter JumboLarry D Alkins Jr. is a 30 y.o. male who presents to the emergency department for evaluation of dental pain. He states that he has had a broken tooth for a long time, but tonight the pain developed. He has not taken anything at home. He states that he has no job and no insurance and no way to see a Education officer, communitydentist.  History reviewed. No pertinent past medical history.  There are no active problems to display for this patient.   History reviewed. No pertinent surgical history.  Prior to Admission medications   Medication Sig Start Date End Date Taking? Authorizing Provider  amoxicillin (AMOXIL) 500 MG tablet Take 1 tablet (500 mg total) by mouth 3 (three) times daily. 03/13/16   Chinita Pesterari B Shaquana Buel, FNP  hydrochlorothiazide (HYDRODIURIL) 25 MG tablet Take 1 tablet (25 mg total) by mouth daily. 03/15/15   [name], MD  ibuprofen (MOTRIN IB) 200 MG tablet Take 3 tablets (600 mg total) by mouth every 6 (six) hours as needed. 02/09/15   Jeanmarie PlantJames A McShane, MD  metFORMIN (GLUCOPHAGE) 500 MG tablet Take 1 tablet (500 mg total) by mouth 2 (two) times daily with a meal. 03/15/15 03/14/16  [name], MD  traMADol (ULTRAM) 50 MG tablet Take 1 tablet (50 mg total) by mouth every 6 (six) hours as needed. 03/13/16   Chinita Pesterari B Puneet Masoner, FNP    Allergies Patient has no known allergies.  No family history on file.  Social History Social History  Substance Use Topics  . Smoking status: Never Smoker  . Smokeless tobacco: Never Used  . Alcohol use Not on file    Review of Systems Constitutional: Uncomfortable appearing ENT: Positive for dental pain Musculoskeletal: Negative for jaw pain/trismus. Skin: Negative for facial swelling or  erythema ____________________________________________   PHYSICAL EXAM:  VITAL SIGNS: ED Triage Vitals [03/13/16 2155]  Enc Vitals Group     BP (!) 169/88     Pulse Rate 70     Resp 18     Temp 98.3 F (36.8 C)     Temp Source Oral     SpO2 97 %     Weight 230 lb (104.3 kg)     Height 5\' 7"  (1.702 m)     Head Circumference      Peak Flow      Pain Score 10     Pain Loc      Pain Edu?      Excl. in GC?     Constitutional: Alert and oriented. Well appearing and in no acute distress. Eyes: Conjunctivae are normal.EOMI. Mouth/Throat: Mucous membranes are moist. Oropharynx non-erythematous. Periodontal Exam    Hematological/Lymphatic/Immunilogical: No cervical lymphadenopathy. Respiratory: Normal respiratory effort.  Musculoskeletal: Full ROM x 4 extremities. Neurologic:  Normal speech and language. No gross focal neurologic deficits are appreciated. Speech is normal. No gait instability. Skin:  No edema or erythema about the neck or face. Psychiatric: Mood and affect are normal. Speech and behavior are normal.  ____________________________________________   LABS (all labs ordered are listed, but only abnormal results are displayed)  Labs Reviewed - No data to display ____________________________________________   RADIOLOGY  Not indicated ____________________________________________   PROCEDURES  Procedure(s) performed: None  Critical Care performed: No  ____________________________________________   INITIAL IMPRESSION /  ASSESSMENT AND PLAN / ED COURSE  Pertinent labs & imaging results that were available during my care of the patient were reviewed by me and considered in my medical decision making (see chart for details). Patient will be prescribed amoxicillin and tramadol. Patient was advised to see the dentist within 14 days. Also advised to take the antibiotic until finished. Instructed to return to the ER for symptoms that change or worsen if unable  to schedule an appointment. ____________________________________________   FINAL CLINICAL IMPRESSION(S) / ED DIAGNOSES  Final diagnoses:  Pain, dental    Note:  This document was prepared using Dragon voice recognition software and may include unintentional dictation errors.    Chinita PesterCari B Froilan Mclean, FNP 03/13/16 2250    Minna AntisKevin Paduchowski, MD 03/13/16 [phone]

## 2016-03-13 NOTE — ED Notes (Signed)
Pt c/o broken tooth/tooth pain to lower molar/jaw left side.

## 2016-03-13 NOTE — ED Notes (Signed)
ED Provider at bedside. 

## 2016-03-13 NOTE — ED Triage Notes (Addendum)
Patient presents with LEFT lower jaw pain secondary to dental caries/fracture that has been in place for awhile. Pain started to worsen this morning. Denies fever and facial edema. Patient does not have a local dentist. Asking for resources to help him with extraction needs at time of discharge.

## 2016-11-16 ENCOUNTER — Emergency Department
Admission: EM | Admit: 2016-11-16 | Discharge: 2016-11-16 | Disposition: A | Discharge status: Home / Self Care | Payer: Self-pay | Attending: Emergency Medicine | Admitting: Emergency Medicine

## 2016-11-16 ENCOUNTER — Emergency Department: Payer: Self-pay

## 2016-11-16 DIAGNOSIS — Y9389 Activity, other specified: Secondary | ICD-10-CM | POA: Insufficient documentation

## 2016-11-16 DIAGNOSIS — S62514A Nondisplaced fracture of proximal phalanx of right thumb, initial encounter for closed fracture: Secondary | ICD-10-CM | POA: Insufficient documentation

## 2016-11-16 DIAGNOSIS — Y999 Unspecified external cause status: Secondary | ICD-10-CM | POA: Insufficient documentation

## 2016-11-16 DIAGNOSIS — S62514B Nondisplaced fracture of proximal phalanx of right thumb, initial encounter for open fracture: Secondary | ICD-10-CM

## 2016-11-16 DIAGNOSIS — Z79899 Other long term (current) drug therapy: Secondary | ICD-10-CM | POA: Insufficient documentation

## 2016-11-16 DIAGNOSIS — Y92009 Unspecified place in unspecified non-institutional (private) residence as the place of occurrence of the external cause: Secondary | ICD-10-CM | POA: Insufficient documentation

## 2016-11-16 DIAGNOSIS — W312XXA Contact with powered woodworking and forming machines, initial encounter: Secondary | ICD-10-CM | POA: Insufficient documentation

## 2016-11-16 DIAGNOSIS — S61011A Laceration without foreign body of right thumb without damage to nail, initial encounter: Secondary | ICD-10-CM | POA: Insufficient documentation

## 2016-11-16 MED ORDER — LIDOCAINE HCL (PF) 1 % IJ SOLN
5.0000 mL | Freq: Once | INTRAMUSCULAR | Status: AC
  Administered 2016-11-16: 5 mL
  Filled 2016-11-16: qty 5

## 2016-11-16 MED ORDER — SULFAMETHOXAZOLE-TRIMETHOPRIM 800-160 MG PO TABS
1.0000 | ORAL_TABLET | Freq: Once | ORAL | Status: AC
  Administered 2016-11-16: 1 via ORAL
  Filled 2016-11-16: qty 1

## 2016-11-16 MED ORDER — SULFAMETHOXAZOLE-TRIMETHOPRIM 800-160 MG PO TABS: 1.0000 | ORAL_TABLET | Freq: Two times a day (BID) | ORAL | 0 refills | Status: DC

## 2016-11-16 NOTE — ED Triage Notes (Signed)
Pt was using a blade saw at home and cut his right thumb.  Pt states pain 4/10.  Pt is A&Ox4.

## 2016-11-16 NOTE — ED Provider Notes (Signed)
[facility] Emergency Department Provider Note ____________________________________________  Time seen: 1544  I have reviewed the triage vital signs and the nursing notes.  HISTORY  Chief Complaint  Laceration  HPI Peter Buckley. is a 31 y.o. male presents to the ED for evaluation of a laceration to the dorsal right thumb. Patient describes using a skill saw at home, when he accidentally cut the top of his right thumb. He reports pain at a 4/10 in triage. He denies any distal paresthesias, or disability. He reports a current tetanus status.  History reviewed. No pertinent past medical history.  There are no active problems to display for this patient.  History reviewed. No pertinent surgical history.  Prior to Admission medications   Medication Sig Start Date End Date Taking? Authorizing Provider  amoxicillin (AMOXIL) 500 MG tablet Take 1 tablet (500 mg total) by mouth 3 (three) times daily. 03/13/16   Triplett, Cari B, FNP  hydrochlorothiazide (HYDRODIURIL) 25 MG tablet Take 1 tablet (25 mg total) by mouth daily. 03/15/15   Darci Current, MD  ibuprofen (MOTRIN IB) 200 MG tablet Take 3 tablets (600 mg total) by mouth every 6 (six) hours as needed. 02/09/15   Jeanmarie Plant, MD  metFORMIN (GLUCOPHAGE) 500 MG tablet Take 1 tablet (500 mg total) by mouth 2 (two) times daily with a meal. 03/15/15 03/14/16  Darci Current, MD  traMADol (ULTRAM) 50 MG tablet Take 1 tablet (50 mg total) by mouth every 6 (six) hours as needed. 03/13/16   Chinita Pester, FNP    Allergies Patient has no known allergies.  History reviewed. No pertinent family history.  Social History Social History  Substance Use Topics  . Smoking status: Never Smoker  . Smokeless tobacco: Never Used  . Alcohol use Not on file    Review of Systems  Constitutional: Negative for fever. Cardiovascular: Negative for chest pain. Respiratory: Negative for shortness of  breath. Musculoskeletal: Negative for back pain. Right thumb lac as above Skin: Negative for rash. Neurological: Negative for headaches, focal weakness or numbness. ____________________________________________  PHYSICAL EXAM:  VITAL SIGNS: ED Triage Vitals  Enc Vitals Group     BP 11/16/16 1525 (!) 148/95     Pulse Rate 11/16/16 1525 (!) 56     Resp 11/16/16 1525 18     Temp 11/16/16 1525 98.4 F (36.9 C)     Temp Source 11/16/16 1525 Oral     SpO2 11/16/16 1525 99 %     Weight 11/16/16 1528 206 lb (93.4 kg)     Height 11/16/16 1528 5\' 3"  (1.6 m)     Head Circumference --      Peak Flow --      Pain Score 11/16/16 1524 4     Pain Loc --      Pain Edu? --      Excl. in GC? --     Constitutional: Alert and oriented. Well appearing and in no distress. Head: Normocephalic and atraumatic. Cardiovascular: Normal rate, regular rhythm. Normal distal pulses. Respiratory: Normal respiratory effort. No wheezes/rales/rhonchi. Musculoskeletal: Normal composite fist. Dorsal right thumb whita  Horizontal lac over the proximal phalanxNontender with normal range of motion in all extremities.  Neurologic:  Normal gross sensation. Normal intrinsic & opposition testing. No gross focal neurologic deficits are appreciated. Skin:  Skin is warm, dry and intact. No rash noted. ____________________________________________   RADIOLOGY  Right Thumb IMPRESSION: Soft tissue injury as described.  Questionable bony fragment adjacent  to the midportion of the first proximal phalanx as described.  I, Alvon Nygaard, Charlesetta Ivory, personally viewed and evaluated these images (plain radiographs) as part of my medical decision making, as well as reviewing the written report by the radiologist. ____________________________________________  PROCEDURES  Bactrim DS 1 PO  LACERATION REPAIR Performed by: Lissa Hoard Authorized by: Lissa Hoard Consent: Verbal consent obtained. Risks  and benefits: risks, benefits and alternatives were discussed Consent given by: patient Patient identity confirmed: provided demographic data Prepped and Draped in normal sterile fashion Wound explored  Laceration Location: right thumb  Laceration Length: 4 cm  No Foreign Bodies seen or palpated Extensor tendon with partial laceration noted.   Anesthesia: transthecal infiltration  Local anesthetic: lidocaine 1% w/o epinephrine  Anesthetic total: 3 ml  Irrigation method: syringe Amount of cleaning: standard  Skin closure: 5-0 nylon   Number of sutures: 8  Technique: interrupted  Patient tolerance: Patient tolerated the procedure well with no immediate complications. ____________________________________________  INITIAL IMPRESSION / ASSESSMENT AND PLAN / ED COURSE  Patient was ED evaluation of a left thumb laceration with a skill saw. The patient's x-ray shows a small radiolucency, could represent a small bony fragment. As such, patient will be treated as if he has an open fracture. He is placed on Bactrim DS for prophylaxis. His laceration also reveals a partial laceration to the extensor tendon at the proximal phalanx. Patient's exam is otherwise negative for any acute dysfunction or tendon deficit. He'll follow up with Ortho-hand for ongoing symptom management and wound rechecks. ____________________________________________  FINAL CLINICAL IMPRESSION(S) / ED DIAGNOSES  Final diagnoses:  Laceration of right thumb without foreign body without damage to nail, initial encounter  Open nondisplaced fracture of proximal phalanx of right thumb, initial encounter      Lissa Hoard, PA-C 11/16/16 1709    Sharman Cheek, MD 11/16/16 2024

## 2016-11-16 NOTE — Discharge Instructions (Addendum)
Your have had your laceration repaired using sutures. There was a very small finding on your x-ray that may represent a small chip of bone. You are being treated with antibiotics for preventing infections. Follow-up with Dr. Ardyth Harps for further wound care and any problems. Have your sutures removed in 10-14 days. Keep the wound clean, dry, and covered.

## 2016-11-16 NOTE — ED Notes (Signed)
First Nurse: pt presents with laceration to thumb with a skill saw. Digit still attached.

## 2016-12-02 ENCOUNTER — Inpatient Hospital Stay (HOSPITAL_COMMUNITY)
Admission: EM | Admit: 2016-12-02 | Discharge: 2016-12-03 | DRG: 965 | Disposition: A | Discharge status: Home / Self Care | Payer: Self-pay | Attending: General Surgery | Admitting: General Surgery

## 2016-12-02 ENCOUNTER — Emergency Department (HOSPITAL_COMMUNITY): Payer: Self-pay

## 2016-12-02 ENCOUNTER — Encounter (HOSPITAL_COMMUNITY): Payer: Self-pay | Admitting: General Practice

## 2016-12-02 DIAGNOSIS — R413 Other amnesia: Secondary | ICD-10-CM | POA: Diagnosis present

## 2016-12-02 DIAGNOSIS — M546 Pain in thoracic spine: Secondary | ICD-10-CM | POA: Diagnosis present

## 2016-12-02 DIAGNOSIS — M79605 Pain in left leg: Secondary | ICD-10-CM | POA: Diagnosis present

## 2016-12-02 DIAGNOSIS — E119 Type 2 diabetes mellitus without complications: Secondary | ICD-10-CM | POA: Diagnosis present

## 2016-12-02 DIAGNOSIS — M79604 Pain in right leg: Secondary | ICD-10-CM | POA: Diagnosis present

## 2016-12-02 DIAGNOSIS — I629 Nontraumatic intracranial hemorrhage, unspecified: Secondary | ICD-10-CM

## 2016-12-02 DIAGNOSIS — S00511A Abrasion of lip, initial encounter: Secondary | ICD-10-CM | POA: Diagnosis present

## 2016-12-02 DIAGNOSIS — S066X9A Traumatic subarachnoid hemorrhage with loss of consciousness of unspecified duration, initial encounter: Principal | ICD-10-CM | POA: Diagnosis present

## 2016-12-02 DIAGNOSIS — Y92411 Interstate highway as the place of occurrence of the external cause: Secondary | ICD-10-CM

## 2016-12-02 DIAGNOSIS — R402413 Glasgow coma scale score 13-15, at hospital admission: Secondary | ICD-10-CM | POA: Diagnosis present

## 2016-12-02 DIAGNOSIS — Y9389 Activity, other specified: Secondary | ICD-10-CM

## 2016-12-02 DIAGNOSIS — T07XXXA Unspecified multiple injuries, initial encounter: Secondary | ICD-10-CM

## 2016-12-02 DIAGNOSIS — Z23 Encounter for immunization: Secondary | ICD-10-CM

## 2016-12-02 DIAGNOSIS — S0031XA Abrasion of nose, initial encounter: Secondary | ICD-10-CM | POA: Diagnosis present

## 2016-12-02 DIAGNOSIS — S27322A Contusion of lung, bilateral, initial encounter: Secondary | ICD-10-CM

## 2016-12-02 DIAGNOSIS — Z7984 Long term (current) use of oral hypoglycemic drugs: Secondary | ICD-10-CM

## 2016-12-02 DIAGNOSIS — I609 Nontraumatic subarachnoid hemorrhage, unspecified: Secondary | ICD-10-CM

## 2016-12-02 DIAGNOSIS — S0081XA Abrasion of other part of head, initial encounter: Secondary | ICD-10-CM | POA: Diagnosis present

## 2016-12-02 HISTORY — DX: Type 2 diabetes mellitus without complications: E11.9

## 2016-12-02 HISTORY — DX: Contusion of lung, bilateral, initial encounter: S27.322A

## 2016-12-02 LAB — COMPREHENSIVE METABOLIC PANEL
ALT: 25 U/L (ref 17–63)
ANION GAP: 9 (ref 5–15)
AST: 30 U/L (ref 15–41)
Albumin: 3.7 g/dL (ref 3.5–5.0)
Alkaline Phosphatase: 57 U/L (ref 38–126)
BILIRUBIN TOTAL: 0.6 mg/dL (ref 0.3–1.2)
BUN: 12 mg/dL (ref 6–20)
CO2: 26 mmol/L (ref 22–32)
Calcium: 8.8 mg/dL — ABNORMAL LOW (ref 8.9–10.3)
Chloride: 102 mmol/L (ref 101–111)
Creatinine, Ser: 1.1 mg/dL (ref 0.61–1.24)
GFR calc Af Amer: >60 mL/min (ref >60)
Glucose, Bld: 316 mg/dL — ABNORMAL HIGH (ref 65–99)
POTASSIUM: 3.7 mmol/L (ref 3.5–5.1)
Sodium: 137 mmol/L (ref 135–145)
TOTAL PROTEIN: 6.4 g/dL — AB (ref 6.5–8.1)

## 2016-12-02 LAB — GLUCOSE, CAPILLARY
GLUCOSE-CAPILLARY: 206 mg/dL — AB (ref 65–99)
Glucose-Capillary: 218 mg/dL — ABNORMAL HIGH (ref 65–99)

## 2016-12-02 LAB — ETHANOL

## 2016-12-02 LAB — CBC
HEMATOCRIT: 44.4 % (ref 39.0–52.0)
Hemoglobin: 15.4 g/dL (ref 13.0–17.0)
MCH: 28.9 pg (ref 26.0–34.0)
MCHC: 34.7 g/dL (ref 30.0–36.0)
MCV: 83.3 fL (ref 78.0–100.0)
Platelets: 218 10*3/uL (ref 150–400)
RBC: 5.33 MIL/uL (ref 4.22–5.81)
RDW: 12.8 % (ref 11.5–15.5)
WBC: 10.5 10*3/uL (ref 4.0–10.5)

## 2016-12-02 LAB — MRSA PCR SCREENING: MRSA BY PCR: NEGATIVE

## 2016-12-02 MED ORDER — ONDANSETRON HCL 4 MG/2ML IJ SOLN: 4.0000 mg | Freq: Four times a day (QID) | INTRAMUSCULAR | Status: DC | PRN

## 2016-12-02 MED ORDER — BACITRACIN ZINC 500 UNIT/GM EX OINT
TOPICAL_OINTMENT | Freq: Two times a day (BID) | CUTANEOUS | Status: DC
  Administered 2016-12-03: 31.5556 via TOPICAL
  Filled 2016-12-02 (×2): qty 28.35

## 2016-12-02 MED ORDER — ONDANSETRON 4 MG PO TBDP: 4.0000 mg | ORAL_TABLET | Freq: Four times a day (QID) | ORAL | Status: DC | PRN

## 2016-12-02 MED ORDER — ONDANSETRON HCL 4 MG/2ML IJ SOLN
4.0000 mg | Freq: Once | INTRAMUSCULAR | Status: AC
  Administered 2016-12-02: 4 mg via INTRAVENOUS

## 2016-12-02 MED ORDER — HYDROCODONE-ACETAMINOPHEN 5-325 MG PO TABS
2.0000 | ORAL_TABLET | ORAL | Status: DC | PRN
  Administered 2016-12-02 – 2016-12-03 (×3): 2 via ORAL
  Filled 2016-12-02 (×5): qty 2

## 2016-12-02 MED ORDER — ONDANSETRON HCL 4 MG/2ML IJ SOLN
INTRAMUSCULAR | Status: AC
  Filled 2016-12-02: qty 2

## 2016-12-02 MED ORDER — PNEUMOCOCCAL VAC POLYVALENT 25 MCG/0.5ML IJ INJ
0.5000 mL | INJECTION | INTRAMUSCULAR | Status: DC
  Filled 2016-12-02: qty 0.5

## 2016-12-02 MED ORDER — LACTATED RINGERS IV SOLN
INTRAVENOUS | Status: DC
  Administered 2016-12-02 – 2016-12-03 (×3): via INTRAVENOUS

## 2016-12-02 MED ORDER — MORPHINE SULFATE (PF) 4 MG/ML IV SOLN
INTRAVENOUS | Status: AC
  Filled 2016-12-02: qty 1

## 2016-12-02 MED ORDER — METHOCARBAMOL 1000 MG/10ML IJ SOLN
500.0000 mg | Freq: Four times a day (QID) | INTRAVENOUS | Status: DC | PRN
  Administered 2016-12-03: 500 mg via INTRAVENOUS
  Filled 2016-12-02 (×2): qty 5

## 2016-12-02 MED ORDER — DOCUSATE SODIUM 100 MG PO CAPS
200.0000 mg | ORAL_CAPSULE | Freq: Two times a day (BID) | ORAL | Status: DC
  Administered 2016-12-02 – 2016-12-03 (×2): 200 mg via ORAL
  Filled 2016-12-02 (×2): qty 2

## 2016-12-02 MED ORDER — TETANUS-DIPHTH-ACELL PERTUSSIS 5-2.5-18.5 LF-MCG/0.5 IM SUSP
INTRAMUSCULAR | Status: AC
  Filled 2016-12-02: qty 0.5

## 2016-12-02 MED ORDER — HYDROCODONE-ACETAMINOPHEN 5-325 MG PO TABS
1.0000 | ORAL_TABLET | ORAL | Status: DC | PRN
Start: 2016-12-02 — End: 2016-12-03
  Administered 2016-12-02: 1 via ORAL
  Filled 2016-12-02: qty 1

## 2016-12-02 MED ORDER — INSULIN ASPART 100 UNIT/ML ~~LOC~~ SOLN
0.0000 [IU] | Freq: Three times a day (TID) | SUBCUTANEOUS | Status: DC
  Administered 2016-12-03: 5 [IU] via SUBCUTANEOUS
  Administered 2016-12-03: 8 [IU] via SUBCUTANEOUS

## 2016-12-02 MED ORDER — MORPHINE SULFATE (PF) 4 MG/ML IV SOLN
4.0000 mg | Freq: Once | INTRAVENOUS | Status: AC
  Administered 2016-12-02: 4 mg via INTRAVENOUS
  Filled 2016-12-02: qty 1

## 2016-12-02 MED ORDER — IOPAMIDOL (ISOVUE-300) INJECTION 61%
INTRAVENOUS | Status: AC
  Administered 2016-12-02: 100 mL via INTRAVENOUS
  Filled 2016-12-02: qty 100

## 2016-12-02 MED ORDER — MORPHINE SULFATE (PF) 4 MG/ML IV SOLN
4.0000 mg | Freq: Once | INTRAVENOUS | Status: AC
  Administered 2016-12-02: 4 mg via INTRAVENOUS

## 2016-12-02 MED ORDER — SODIUM CHLORIDE 0.9 % IV SOLN
500.0000 mg | Freq: Two times a day (BID) | INTRAVENOUS | Status: DC
  Administered 2016-12-02 – 2016-12-03 (×3): 500 mg via INTRAVENOUS
  Filled 2016-12-02 (×4): qty 5

## 2016-12-02 MED ORDER — ACETAMINOPHEN 325 MG PO TABS
650.0000 mg | ORAL_TABLET | ORAL | Status: DC | PRN
  Administered 2016-12-02: 650 mg via ORAL
  Filled 2016-12-02: qty 2

## 2016-12-02 MED ORDER — MORPHINE SULFATE (PF) 4 MG/ML IV SOLN
4.0000 mg | INTRAVENOUS | Status: DC | PRN
  Administered 2016-12-02: 4 mg via INTRAVENOUS

## 2016-12-02 MED ORDER — INSULIN ASPART 100 UNIT/ML ~~LOC~~ SOLN
0.0000 [IU] | Freq: Every day | SUBCUTANEOUS | Status: DC
  Administered 2016-12-02: 2 [IU] via SUBCUTANEOUS

## 2016-12-02 MED ORDER — TETANUS-DIPHTH-ACELL PERTUSSIS 5-2.5-18.5 LF-MCG/0.5 IM SUSP
0.5000 mL | Freq: Once | INTRAMUSCULAR | Status: AC
  Administered 2016-12-02: 0.5 mL via INTRAMUSCULAR

## 2016-12-02 NOTE — ED Notes (Signed)
While lying flat in CT, patient SpO2 reading low in 80's, placed patient on nasal cannula, sats improved.
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
## 2016-12-02 NOTE — ED Notes (Signed)
Pt returned from CT °
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
## 2016-12-02 NOTE — ED Notes (Signed)
Pt transported to CT ?

## 2016-12-03 LAB — GLUCOSE, CAPILLARY
GLUCOSE-CAPILLARY: 269 mg/dL — AB (ref 65–99)
GLUCOSE-CAPILLARY: 245 mg/dL — AB (ref 65–99)

## 2016-12-03 LAB — HIV ANTIBODY (ROUTINE TESTING W REFLEX): HIV SCREEN 4TH GENERATION: NONREACTIVE

## 2016-12-03 MED ORDER — METHOCARBAMOL 500 MG PO TABS: 500.0000 mg | ORAL_TABLET | Freq: Four times a day (QID) | ORAL | Status: DC | PRN

## 2016-12-03 MED ORDER — LEVETIRACETAM 500 MG PO TABS: 500.0000 mg | ORAL_TABLET | Freq: Two times a day (BID) | ORAL | 0 refills | Status: DC

## 2016-12-03 MED ORDER — HYDROCODONE-ACETAMINOPHEN 5-325 MG PO TABS: 1.0000 | ORAL_TABLET | Freq: Four times a day (QID) | ORAL | 0 refills | Status: DC | PRN
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
## 2016-12-03 NOTE — Discharge Instructions (Signed)
1. PAIN CONTROL:  1. Pain is best controlled by a usual combination of three different methods TOGETHER:  1. Ice/Heat 2. Over the counter pain medication 3. Prescription pain medication 2. Ice packs or heating pads (30-60 minutes up to 6 times a day) will help. Use ice for the first few days to help decrease swelling and bruising, then switch to heat to help relax tight/sore spots and speed recovery. Some people prefer to use ice alone, heat alone, alternating between ice & heat. Experiment to what works for you.  3. It is helpful to take an over-the-counter pain medication regularly for the first few weeks. Choose one of the following that works best for you:  1. Naproxen (Aleve, etc) Two 220mg  tabs twice a day 2. Ibuprofen (Advil, etc) Three 200mg  tabs four times a day (every meal & bedtime) 3. Acetaminophen (Tylenol, etc) 500-650mg  four times a day (every meal & bedtime) 4. A prescription for pain medication (such as oxycodone, hydrocodone, etc) should be given to you upon discharge. Take your pain medication as prescribed. DO NOT TAKE medications containing acetaminophen (tylenol) along with the NORCO/VICODEN 1. If you are having problems/concerns with the prescription medicine (does not control pain, nausea, vomiting, rash, itching, etc), please call us [phone] to see if we need to switch you to a different pain medicine that will work better for you and/or control your side effect better. 2. If you need a refill on your pain medication, please contact your pharmacy. They will contact our office to request authorization. Prescriptions will not be filled after 5 pm or on week-ends. 4. Avoid getting constipated. When taking pain medications, it is common to experience some constipation. Increasing fluid intake and taking a fiber supplement (such as Metamucil, Citrucel, FiberCon, MiraLax, etc) 1-2 times a day regularly will usually help prevent this problem from occurring. A mild laxative  (prune juice, Milk of Magnesia, MiraLax, etc) should be taken according to package directions if there are no bowel movements after 48 hours.  5. Watch out for diarrhea. If you have many loose bowel movements, simplify your diet to bland foods & liquids for a few days. Stop any stool softeners and decrease your fiber supplement. Switching to mild anti-diarrheal medications (Kayopectate, Pepto Bismol) can help. If this worsens or does not improve, please call us. 6. FOLLOW UP in our office  1. Please call [facility] at [phone] as needed with any questions or concerns.   WHEN TO CALL us [phone]:  1. Poor pain control 2. Reactions / problems with new medications (rash/itching, nausea, etc)  3. Fever over 101.5 F (38.5 C) 4. Worsening swelling or bruising 5. Dizziness, visual changes, nausea, vomiting, worsening headaches or any other concerning symptoms  The clinic staff is available to answer your questions during regular business hours (8:30am-5pm). Please dont hesitate to call and ask to speak to one of our nurses for clinical concerns.  If you have a medical emergency, go to the nearest emergency room or call 911.  A surgeon from [facility] is always on call at the [facility], [place]  [address], [address], [place], [place] [address] ?  MAIN: [phone] ? TOLL FREE: [phone] ?  FAX [phone]  [email or web address]   Intracranial Hemorrhage An intracranial hemorrhage is bleeding in the layers between the skull (cranium) and brain. A blood vessel bursts and allows blood to leak inside the cranial cavity. The leaking blood then collects (hematoma).  This causes pressure and damage to brain cells. The bleeding can be mild to severe. In severe cases, it can lead to permanent damage or death. Symptoms may come on suddenly or develop over time. Early diagnosis and treatment leads to better recovery. There are four types of  intracranial hemorrhage: subarachnoid, subdural, extradural, or cerebral hemorrhage. What are the causes?  Head injury (trauma).  Ruptured brain aneurysm.  Bleeding from blood vessels that develop abnormally (arteriovenous malformation).  Bleeding disorder.  Use of blood thinners (anticoagulants).  Use of certain drugs, such as cocaine. For some people with intracranial hemorrhage, the cause is unknown. What increases the risk?  Using tobacco products, such as cigarettes and chewing tobacco.  Having high blood pressure (hypertension).  Abusing alcohol.  Being a male, especially of postmenopausal age.  Having a family history of disease in the blood vessels of the brain (cerebrovascular disease).  Having certain genetic syndromes that result in kidney disease or connective tissue disease. What are the signs or symptoms?  A sudden, severe headache with no known cause. The headache is often described as the worst headache ever experienced.  Nausea or vomiting, especially when combined with other symptoms such as a headache.  Sudden weakness or numbness of the face, arm, or leg, especially on one side of the body.  Sudden trouble walking or difficulty moving arms or legs.  Sudden confusion.  Sudden personality changes.  Trouble speaking (aphasia) or understanding.  Difficulty swallowing.  Sudden trouble seeing in one or both eyes.  Double vision.  Dizziness.  Loss of balance or coordination.  Intolerance to light.  Stiff neck. How is this diagnosed? Your health care provider will perform a physical exam and ask about your symptoms. If an intracranial hemorrhage is suspected, various tests may be ordered. These tests may include:  A CT scan.  An MRI.  A cerebral angiogram.  A spinal tap (lumbar puncture).  Blood tests.  How is this treated? Immediate treatment in the hospital is often required to reduce the risk of brain damage. Treatment will  depend on the cause of the bleeding, where it is located, and the extent of the bleeding and damage. The goals of treatment include stopping the bleeding, repairing the cause of bleeding, providing relief of symptoms, and preventing problems.  Medicines may be given to: ? Lower blood pressure (antihypertensives). ? Relieve pain (analgesics). ? Relieve nausea or vomiting.  Surgery may be needed to stop the bleeding, repair the cause of the bleeding, or remove the blood.  Rehabilitation may be needed to improve any cognitive and day-to-day functions impaired by the condition.  Further treatment depends on the duration, severity, and cause of your symptoms. Physical, speech, and occupational therapists will assess you and work to improve any functions impaired by the intracranial hemorrhage. Measures will be taken to prevent short-term and long-term problems, including infection from breathing foreign material into the lungs (aspiration pneumonia), blood clots in the legs, bedsores, and falls. Follow these instructions at home:  Take medicines only as directed by your health care provider.  Eat healthy foods as directed by your health care provider: ? A diet low in salt (sodium), saturated fat, trans fat, and cholesterol may be recommended to manage your blood pressure. ? Foods may need to be soft or pureed, or small bites may need to be taken in order to avoid aspirating or choking. ? If studies show that your ability to swallow safely has been affected, you may need to seek help  from specialists such as a dietitian, speech and language pathologist, or an occupational therapist. These health care providers can teach you how to safely get the nutrition your body needs.  Rest and limit activities or movements as directed by your health care provider.  Do not use any tobacco products including cigarettes, chewing tobacco, or electronic cigarettes. If you need help quitting, ask your health care  provider.  Limit alcohol intake to no more than 1 drink per day for nonpregnant women and 2 drinks per day for men. One drink equals 12 ounces of beer, 5 ounces of wine, or 1 ounces of hard liquor.  Make any other lifestyle changes as directed by your health care provider.  Monitor and record your blood pressure as directed by your health care provider.  A safe home environment is important to reduce the risk of falls. Your health care provider may arrange for specialists to evaluate your home. Having grab bars in the bedroom and bathroom is often important. Your health care provider may arrange for special equipment to be used at home, such as raised toilets and a seat for the shower.  Do physical, occupational, and speech therapy as directed by your health care provider. Ongoing therapy may be needed to maximize your recovery.  Use a walker or a cane at all times if directed by your health care provider.  Keep all follow-up visits with your health care provider and other specialists. This is important. This includes any referrals, physical therapy, and rehabilitation. Get help right away if:  You have a sudden, severe headache with no known cause.  You have nausea or vomiting occurring with another symptom.  You have sudden weakness or numbness of the face, arm, or leg, especially on one side of the body.  You have sudden trouble walking or difficulty moving your arms or legs.  You have sudden confusion.  You have trouble speaking (aphasia) or understanding.  You have sudden trouble seeing in one or both eyes.  You have a sudden loss of balance or coordination.  You have a stiff neck.  You have difficulty breathing.  You have a partial or total loss of consciousness. These symptoms may represent a serious problem that is an emergency. Do not wait to see if the symptoms will go away. Get medical help right away. Call your local emergency services (911 in the [place] Do not  drive yourself to the hospital. This information is not intended to replace advice given to you by your health care provider. Make sure you discuss any questions you have with your health care provider. Document Released: 10/12/2013 Document Revised: 08/17/2015 Document Reviewed: 05/11/2013 Elsevier Interactive Patient Education  2018 [employer].   Concussion, Adult A concussion is a brain injury from a direct hit (blow) to the head or body. This blow causes the brain to shake quickly back and forth inside the skull. This can damage brain cells and cause chemical changes in the brain. A concussion may also be known as a mild traumatic brain injury (TBI). Concussions are usually not life-threatening, but the effects of a concussion can be serious. If you have a concussion, you are more likely to experience concussion-like symptoms after a direct blow to the head in the future. What are the causes? This condition is caused by:  A direct blow to the head, such as from running into another player during a game, being hit in a fight, or hitting your head on a hard surface.  A  jolt of the head or neck that causes the brain to move back and forth inside the skull, such as in a car crash.  What are the signs or symptoms? The signs of a concussion can be hard to notice. Early on, they may be missed by you, family members, and health care providers. You may look fine but act or feel differently. Symptoms are usually temporary, but they may last for days, weeks, or even longer. Some symptoms may appear right away but other symptoms may not show up for hours or days. Every head injury is different. Symptoms may include:  Headaches. This can include a feeling of pressure in the head.  Memory problems.  Trouble concentrating, organizing, or making decisions.  Slowness in thinking, acting or reacting, speaking, or reading.  Confusion.  Fatigue.  Changes in eating or sleeping patterns.  Problems  with coordination or balance.  Nausea or vomiting.  Numbness or tingling.  Sensitivity to light or noise.  Vision or hearing problems.  Reduced sense of smell.  Irritability or mood changes.  Dizziness.  Lack of motivation.  Seeing or hearing things that other people do not see or hear (hallucinations).  How is this diagnosed? This condition is diagnosed based on:  Your symptoms.  A description of your injury.  You may also have tests, including:  Imaging tests, such as a CT scan or MRI. These are done to look for signs of brain injury.  Neuropsychological tests. These measure your thinking, understanding, learning, and remembering abilities.  How is this treated? This condition is treated with physical and mental rest and careful observation, usually at home. If the concussion is severe, you may need to stay home from work for a while. You may be referred to a concussion clinic or to other health care providers for management. It is important that you tell your health care provider if:  You are taking any medicines, including prescription medicines, over-the-counter medicines, and natural remedies. Some medicines, such as blood thinners (anticoagulants) and aspirin, may increase the chance of complications, such as bleeding.  You are taking or have taken alcohol or illegal drugs. Alcohol and certain other drugs may slow your recovery and can put you at risk of further injury.  How fast you will recover from a concussion depends on many factors, such as how severe your concussion is, what part of your brain was injured, how old you are, and how healthy you were before the concussion. Recovery can take time. It is important to wait to return to activity until a health care provider says it is safe to do that and your symptoms are completely gone. Follow these instructions at home: Activity  Limit activities that require a lot of thought or concentration. These may  include: ? Doing homework or job-related work. ? Watching TV. ? Working on the computer. ? Playing memory games and puzzles.  Rest. Rest helps the brain to heal. Make sure you: ? Get plenty of sleep at night. Avoid staying up late at night. ? Keep the same bedtime hours on weekends and weekdays. ? Rest during the day. Take naps or rest breaks when you feel tired.  Having another concussion before the first one has healed can be dangerous. Do not do high-risk activities that could cause a second concussion, such as riding a bicycle or playing sports.  Ask your health care provider when you can return to your normal activities, such as school, work, athletics, driving, riding a bicycle, or  using heavy machinery. Your ability to react may be slower after a brain injury. Never do these activities if you are dizzy. Your health care provider will likely give you a plan for gradually returning to activities. General instructions  Take over-the-counter and prescription medicines only as told by your health care provider.  Do not drink alcohol until your health care provider says you can.  If it is harder than usual to remember things, write them down.  If you are easily distracted, try to do one thing at a time. For example, do not try to watch TV while fixing dinner.  Talk with family members or close friends when making important decisions.  Watch your symptoms and tell others to do the same. Complications sometimes occur after a concussion. Older adults with a brain injury may have a higher risk of serious complications, such as a blood clot in the brain.  Tell your teachers, school nurse, school counselor, coach, athletic trainer, or work Production designer, theatre/television/filmmanager about your injury, symptoms, and restrictions. Tell them about what you can or cannot do. They should watch for: ? Increased problems with attention or concentration. ? Increased difficulty remembering or learning new information. ? Increased time  needed to complete tasks or assignments. ? Increased irritability or decreased ability to cope with stress. ? Increased symptoms.  Keep all follow-up visits as told by your health care provider. This is important. How is this prevented? It is very important to avoid another brain injury, especially as you recover. In rare cases, another injury can lead to permanent brain damage, brain swelling, or death. The risk of this is greatest during the first 7-10 days after a head injury. Avoid injuries by:  Wearing a seat belt when riding in a car.  Wearing a helmet when biking, skiing, skateboarding, skating, or doing similar activities.  Avoiding activities that could lead to a second concussion, such as contact or recreational sports, until your health care provider says it is okay.  Taking safety measures in your home, such as: ? Removing clutter and tripping hazards from floors and stairways. ? Using grab bars in bathrooms and handrails by stairs. ? Placing non-slip mats on floors and in bathtubs. ? Improving lighting in dim areas.  Contact a health care provider if:  Your symptoms get worse.  You have new symptoms.  You continue to have symptoms for more than 2 weeks. Get help right away if:  You have severe or worsening headaches.  You have weakness or numbness in any part of your body.  Your coordination gets worse.  You vomit repeatedly.  You are sleepier.  The pupil of one eye is larger than the other.  You have convulsions or a seizure.  Your speech is slurred.  Your fatigue, confusion, or irritability gets worse.  You cannot recognize people or places.  You have neck pain.  It is difficult to wake you up.  You have unusual behavior changes.  You lose consciousness. Summary  A concussion is a brain injury from a direct hit (blow) to the head or body.  A concussion may also be called a mild traumatic brain injury (TBI).  You may have imaging tests and  neuropsychological tests to diagnose a concussion.  This condition is treated with physical and mental rest and careful observation.  Ask your health care provider when you can return to your normal activities, such as school, work, athletics, driving, riding a bicycle, or using heavy machinery. Follow safety instructions as told by  your health care provider. This information is not intended to replace advice given to you by your health care provider. Make sure you discuss any questions you have with your health care provider. Document Released: 06/07/2003 Document Revised: 02/26/2016 Document Reviewed: 02/26/2016 Elsevier Interactive Patient Education  2017 [employer].
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
## 2016-12-03 NOTE — Progress Notes (Signed)
Pt seen and examined.  No issues overnight. Feels well this am.  Minimal headache. No motor/sensory deficits, weakness, paresthesias, etc.  EXAM: Temp:  [97.7 F (36.5 C)-102.8 F (39.3 C)] 97.7 F (36.5 C) (09/05 0400) Pulse Rate:  [56-94] 86 (09/05 0400) Resp:  [13-27] 19 (09/05 0400) BP: (100-161)/(45-90) 104/45 (09/05 0400) SpO2:  [91 %-99 %] 91 % (09/05 0400) Weight:  [90.7 kg (200 lb)-95.3 kg (210 lb 1.6 oz)] 95.3 kg (210 lb 1.6 oz) (09/04 1713) Intake/Output      09/04 0701 - 09/05 0700 09/05 0701 - 09/06 0700   P.O. 480    I.V. (mL/kg) 1286.7 (13.5)    IV Piggyback 210    Total Intake(mL/kg) 1976.7 (20.7)    Urine (mL/kg/hr) 700    Blood 1050    Total Output 1750     Net +226.7           Awake and alert Follows commands throughout Full strength CN grossly intact  Plan Patient doing well this morning Neurologically intact.  Minimal headache. No need for repeat imaging. Ok for discharge from NS perspective. Complete 7 day course of Keppra for seizure prophylaxis F/U 4 weeks out
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
## 2016-12-05 ENCOUNTER — Encounter (HOSPITAL_COMMUNITY): Payer: Self-pay | Admitting: General Surgery

## 2016-12-05 DIAGNOSIS — S27322A Contusion of lung, bilateral, initial encounter: Secondary | ICD-10-CM

## 2016-12-05 HISTORY — DX: Contusion of lung, bilateral, initial encounter: S27.322A

## 2017-09-17 ENCOUNTER — Emergency Department
Admission: EM | Admit: 2017-09-17 | Discharge: 2017-09-18 | Disposition: A | Discharge status: Other Acute Inpatient Hospital | Payer: Self-pay | Attending: Emergency Medicine | Admitting: Emergency Medicine

## 2017-09-17 ENCOUNTER — Encounter: Payer: Self-pay | Admitting: Emergency Medicine

## 2017-09-17 ENCOUNTER — Emergency Department: Payer: Self-pay

## 2017-09-17 ENCOUNTER — Other Ambulatory Visit: Payer: Self-pay

## 2017-09-17 DIAGNOSIS — E119 Type 2 diabetes mellitus without complications: Secondary | ICD-10-CM | POA: Insufficient documentation

## 2017-09-17 DIAGNOSIS — F17290 Nicotine dependence, other tobacco product, uncomplicated: Secondary | ICD-10-CM | POA: Insufficient documentation

## 2017-09-17 DIAGNOSIS — Z91199 Patient's noncompliance with other medical treatment and regimen due to unspecified reason: Secondary | ICD-10-CM

## 2017-09-17 DIAGNOSIS — Z046 Encounter for general psychiatric examination, requested by authority: Secondary | ICD-10-CM | POA: Insufficient documentation

## 2017-09-17 DIAGNOSIS — R441 Visual hallucinations: Secondary | ICD-10-CM | POA: Insufficient documentation

## 2017-09-17 DIAGNOSIS — F419 Anxiety disorder, unspecified: Secondary | ICD-10-CM | POA: Insufficient documentation

## 2017-09-17 DIAGNOSIS — F332 Major depressive disorder, recurrent severe without psychotic features: Secondary | ICD-10-CM | POA: Insufficient documentation

## 2017-09-17 DIAGNOSIS — Z9119 Patient's noncompliance with other medical treatment and regimen: Secondary | ICD-10-CM

## 2017-09-17 DIAGNOSIS — Z79899 Other long term (current) drug therapy: Secondary | ICD-10-CM | POA: Insufficient documentation

## 2017-09-17 DIAGNOSIS — I10 Essential (primary) hypertension: Secondary | ICD-10-CM | POA: Insufficient documentation

## 2017-09-17 DIAGNOSIS — R440 Auditory hallucinations: Secondary | ICD-10-CM | POA: Insufficient documentation

## 2017-09-17 DIAGNOSIS — R45850 Homicidal ideations: Secondary | ICD-10-CM | POA: Insufficient documentation

## 2017-09-17 DIAGNOSIS — R45851 Suicidal ideations: Secondary | ICD-10-CM | POA: Insufficient documentation

## 2017-09-17 DIAGNOSIS — Z7984 Long term (current) use of oral hypoglycemic drugs: Secondary | ICD-10-CM | POA: Insufficient documentation

## 2017-09-17 DIAGNOSIS — F322 Major depressive disorder, single episode, severe without psychotic features: Secondary | ICD-10-CM

## 2017-09-17 LAB — COMPREHENSIVE METABOLIC PANEL
ALBUMIN: 5 g/dL (ref 3.5–5.0)
ALK PHOS: 64 U/L (ref 38–126)
ALT: 25 U/L (ref 17–63)
AST: 24 U/L (ref 15–41)
Anion gap: 12 (ref 5–15)
BILIRUBIN TOTAL: 1.4 mg/dL — AB (ref 0.3–1.2)
BUN: 14 mg/dL (ref 6–20)
CALCIUM: 9.4 mg/dL (ref 8.9–10.3)
CO2: 22 mmol/L (ref 22–32)
Chloride: 102 mmol/L (ref 101–111)
Creatinine, Ser: 1.06 mg/dL (ref 0.61–1.24)
GFR calc Af Amer: >60 mL/min (ref >60)
GFR calc non Af Amer: >60 mL/min (ref >60)
GLUCOSE: 311 mg/dL — AB (ref 65–99)
Potassium: 3.2 mmol/L — ABNORMAL LOW (ref 3.5–5.1)
Sodium: 136 mmol/L (ref 135–145)
Total Protein: 8 g/dL (ref 6.5–8.1)

## 2017-09-17 LAB — URINE DRUG SCREEN, QUALITATIVE (ARMC ONLY)
AMPHETAMINES, UR SCREEN: NOT DETECTED
BENZODIAZEPINE, UR SCRN: NOT DETECTED
Cannabinoid 50 Ng, Ur ~~LOC~~: POSITIVE — AB
Cocaine Metabolite,Ur ~~LOC~~: NOT DETECTED
MDMA (Ecstasy)Ur Screen: NOT DETECTED
Methadone Scn, Ur: NOT DETECTED
OPIATE, UR SCREEN: NOT DETECTED
PHENCYCLIDINE (PCP) UR S: NOT DETECTED
Tricyclic, Ur Screen: NOT DETECTED

## 2017-09-17 LAB — CBC
HEMATOCRIT: 47.9 % (ref 40.0–52.0)
Hemoglobin: 16.9 g/dL (ref 13.0–18.0)
MCH: 29.7 pg (ref 26.0–34.0)
MCHC: 35.3 g/dL (ref 32.0–36.0)
MCV: 84 fL (ref 80.0–100.0)
Platelets: 250 10*3/uL (ref 150–440)
RBC: 5.7 MIL/uL (ref 4.40–5.90)
RDW: 12.8 % (ref 11.5–14.5)
WBC: 8.2 10*3/uL (ref 3.8–10.6)

## 2017-09-17 LAB — ACETAMINOPHEN LEVEL: Acetaminophen (Tylenol), Serum: <10 ug/mL — ABNORMAL LOW (ref 10–30)

## 2017-09-17 LAB — ETHANOL: Alcohol, Ethyl (B): <10 mg/dL (ref <10)

## 2017-09-17 LAB — SALICYLATE LEVEL: Salicylate Lvl: <7 mg/dL (ref 2.8–30.0)

## 2017-09-17 MED ORDER — METFORMIN HCL 500 MG PO TABS
500.0000 mg | ORAL_TABLET | Freq: Two times a day (BID) | ORAL | Status: DC
  Filled 2017-09-17 (×2): qty 1

## 2017-09-17 MED ORDER — HYDROCHLOROTHIAZIDE 25 MG PO TABS
25.0000 mg | ORAL_TABLET | Freq: Every day | ORAL | Status: DC
  Administered 2017-09-18: 25 mg via ORAL
  Filled 2017-09-17: qty 1

## 2017-09-17 MED ORDER — LORAZEPAM 1 MG PO TABS
1.0000 mg | ORAL_TABLET | Freq: Once | ORAL | Status: AC
  Administered 2017-09-17: 1 mg via ORAL
  Filled 2017-09-17: qty 1

## 2017-09-17 NOTE — ED Notes (Signed)
Hourly rounding reveals patient sleeping in room. No complaints, stable, in no acute distress. Q15 minute rounds and monitoring via [employer] Cam[employer] to continue. 
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
## 2017-09-17 NOTE — ED Notes (Signed)
PT IVC/ PENDING PLACEMENT  

## 2017-09-17 NOTE — ED Notes (Signed)
Pt belongings include: two silver colored earrings, cowboy boots, a pair of socks, underware, shorts, belt, keys, belt, and shirt.

## 2017-09-17 NOTE — ED Notes (Signed)
Hourly rounding reveals patient in room. No complaints, stable, in no acute distress. Q15 minute rounds and monitoring via Security Cameras to continue. 

## 2017-09-17 NOTE — ED Triage Notes (Signed)
Pt reports that he is suicidal has thought of some ways but no specific plan. Pt is tearful in triage and having a panic attack. States that he has been depressed awhile but cant hold in his SI anymore.

## 2017-09-17 NOTE — ED Notes (Signed)
Pt's belongings, bag 1 of 1, given to pt's mom per his request.
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
## 2017-09-17 NOTE — ED Notes (Signed)
Pt. Transferred to BHU from ED to room 1 after screening for contraband. Report to include Situation, Background, Assessment and [facility]ations [facility]. Pt. Oriented to unit including Q15 minute rounds as well as the security cameras for their protection. Patient is alert and oriented, warm and dry in no acute distress. Patient denies SI, and AVH. Patient states there is one person he wants to hurt. Pt. Encouraged to let me know if needs arise.

## 2017-09-17 NOTE — ED Notes (Signed)
Pt asked about SI at present time and pt states "since I've talked to doctor and nurse I feel a little bit calmer." Denies SI at this time.
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
## 2017-09-18 ENCOUNTER — Inpatient Hospital Stay
Admission: AD | Admit: 2017-09-18 | Discharge: 2017-09-21 | DRG: 885 | Disposition: A | Discharge status: Home / Self Care | Payer: No Typology Code available for payment source | Attending: Psychiatry | Admitting: Psychiatry

## 2017-09-18 ENCOUNTER — Inpatient Hospital Stay: Payer: No Typology Code available for payment source

## 2017-09-18 DIAGNOSIS — F1220 Cannabis dependence, uncomplicated: Secondary | ICD-10-CM | POA: Diagnosis present

## 2017-09-18 DIAGNOSIS — I10 Essential (primary) hypertension: Secondary | ICD-10-CM | POA: Diagnosis present

## 2017-09-18 DIAGNOSIS — Z87891 Personal history of nicotine dependence: Secondary | ICD-10-CM

## 2017-09-18 DIAGNOSIS — Z818 Family history of other mental and behavioral disorders: Secondary | ICD-10-CM

## 2017-09-18 DIAGNOSIS — Z9119 Patient's noncompliance with other medical treatment and regimen: Secondary | ICD-10-CM | POA: Diagnosis not present

## 2017-09-18 DIAGNOSIS — E1165 Type 2 diabetes mellitus with hyperglycemia: Secondary | ICD-10-CM | POA: Diagnosis present

## 2017-09-18 DIAGNOSIS — Z915 Personal history of self-harm: Secondary | ICD-10-CM | POA: Diagnosis not present

## 2017-09-18 DIAGNOSIS — R45851 Suicidal ideations: Secondary | ICD-10-CM | POA: Diagnosis present

## 2017-09-18 DIAGNOSIS — F322 Major depressive disorder, single episode, severe without psychotic features: Secondary | ICD-10-CM | POA: Diagnosis present

## 2017-09-18 DIAGNOSIS — Z9114 Patient's other noncompliance with medication regimen: Secondary | ICD-10-CM | POA: Diagnosis not present

## 2017-09-18 DIAGNOSIS — F410 Panic disorder [episodic paroxysmal anxiety] without agoraphobia: Secondary | ICD-10-CM | POA: Diagnosis present

## 2017-09-18 DIAGNOSIS — Z7984 Long term (current) use of oral hypoglycemic drugs: Secondary | ICD-10-CM

## 2017-09-18 DIAGNOSIS — Z87820 Personal history of traumatic brain injury: Secondary | ICD-10-CM

## 2017-09-18 DIAGNOSIS — E119 Type 2 diabetes mellitus without complications: Secondary | ICD-10-CM | POA: Diagnosis present

## 2017-09-18 DIAGNOSIS — Z79899 Other long term (current) drug therapy: Secondary | ICD-10-CM | POA: Diagnosis not present

## 2017-09-18 HISTORY — DX: Anxiety disorder, unspecified: F41.9

## 2017-09-18 HISTORY — DX: Depression, unspecified: F32.A

## 2017-09-18 HISTORY — DX: Essential (primary) hypertension: I10

## 2017-09-18 HISTORY — DX: Major depressive disorder, single episode, unspecified: F32.9

## 2017-09-18 LAB — GLUCOSE, CAPILLARY: Glucose-Capillary: 266 mg/dL — ABNORMAL HIGH (ref 65–99)

## 2017-09-18 MED ORDER — QUETIAPINE FUMARATE 100 MG PO TABS
100.0000 mg | ORAL_TABLET | Freq: Every day | ORAL | Status: DC
  Administered 2017-09-18 – 2017-09-20 (×3): 100 mg via ORAL
  Filled 2017-09-18 (×3): qty 1

## 2017-09-18 MED ORDER — HYDROCHLOROTHIAZIDE 25 MG PO TABS
25.0000 mg | ORAL_TABLET | Freq: Every day | ORAL | Status: DC
  Administered 2017-09-18 – 2017-09-21 (×4): 25 mg via ORAL
  Filled 2017-09-18 (×4): qty 1

## 2017-09-18 MED ORDER — ACETAMINOPHEN 325 MG PO TABS: 650.0000 mg | ORAL_TABLET | Freq: Four times a day (QID) | ORAL | Status: DC | PRN

## 2017-09-18 MED ORDER — ALUM & MAG HYDROXIDE-SIMETH 200-200-20 MG/5ML PO SUSP: 30.0000 mL | ORAL | Status: DC | PRN

## 2017-09-18 MED ORDER — METFORMIN HCL 500 MG PO TABS
500.0000 mg | ORAL_TABLET | Freq: Two times a day (BID) | ORAL | Status: DC
  Administered 2017-09-18: 500 mg via ORAL
  Filled 2017-09-18: qty 1

## 2017-09-18 MED ORDER — TRAZODONE HCL 100 MG PO TABS
100.0000 mg | ORAL_TABLET | Freq: Every evening | ORAL | Status: DC | PRN
  Filled 2017-09-18: qty 1

## 2017-09-18 MED ORDER — HYDROXYZINE HCL 50 MG PO TABS: 50.0000 mg | ORAL_TABLET | Freq: Four times a day (QID) | ORAL | Status: DC | PRN

## 2017-09-18 MED ORDER — MAGNESIUM HYDROXIDE 400 MG/5ML PO SUSP: 30.0000 mL | Freq: Every day | ORAL | Status: DC | PRN

## 2017-09-18 MED ORDER — METFORMIN HCL 500 MG PO TABS
1000.0000 mg | ORAL_TABLET | Freq: Two times a day (BID) | ORAL | Status: DC
  Administered 2017-09-19 – 2017-09-21 (×5): 1000 mg via ORAL
  Filled 2017-09-18 (×7): qty 2

## 2017-09-18 MED ORDER — VENLAFAXINE HCL ER 75 MG PO CP24
75.0000 mg | ORAL_CAPSULE | Freq: Every day | ORAL | Status: AC
  Administered 2017-09-19 – 2017-09-20 (×2): 75 mg via ORAL
  Filled 2017-09-18 (×2): qty 1

## 2017-09-18 NOTE — ED Notes (Signed)
Hourly rounding reveals patient in room talking to TTS. No complaints, stable, in no acute distress. Q15 minute rounds and monitoring via Security Cameras to continue. 

## 2017-09-18 NOTE — ED Notes (Signed)
Patient is alert and oriented x 4.  Presents with calm affect and pleasant mood.  Reports suicidal thoughts, auditory and visual hallucinations but contracts for safety.  Refused morning Metformin after several encouragements.  Routine safety checks maintained every 15 minutes.  Reports given to Alphonzo CruiseJoanne, Rn.  Verbalizes discharge instruction and transfer protocol.  Personal belongings transported with patient.  Patient safe and stable during discharge and transfer.

## 2017-09-18 NOTE — [facility] Group Notes (Signed)
[facility] Group Notes:  (Nursing/MHT/Case Management/Adjunct)  Date:  09/18/2017  Time:  9:30 PM  Type of Therapy:  Group Therapy  Participation Level:  Active  Participation Quality:  Appropriate  Affect:  Blunted  Cognitive:  Appropriate  Insight:  Appropriate  Engagement in Group:  Engaged  Modes of Intervention:  Discussion  Summary of Progress/Problems:  Peter NeighborsKeith D Kazuma Buckley 09/18/2017, 9:30 PM

## 2017-09-18 NOTE — Progress Notes (Signed)
Talked with BMU Charge Nurse Earlie CountsAlexis Rn; pt has not yet been medically cleared, so order has not yet been written for pt to be admitted to the BMU.

## 2017-09-18 NOTE — BH Assessment (Signed)
Assessment Note  Peter Buckley. is a 32 y.o. male who came to [facility] ED due to feeling depressed for a long time and feeling like he has "hit [his] breaking point." Pt shares his wife told him she was going to take their children away from him. Pt stated he was planning to kill himself and that he has been thinking of different ways in which to do it. Pt states he has attempted to kill himself on one occasion several years prior. Pt shares he has also been having thoughts of wanting to kill his ex-wife's boyfriend "because he likes to run his mouth." Pt states he wants to follow him home after work and "beat him until he's dead." Pt shares he has experienced AVH via seeing people that are not there and hearing people talking that are not there his entire life. He denies NSSIB.  Pt denies he has ever engaged in therapy or in psychiatry services or that he has ever been hospitalized. He states he enrolled himself in anger management services, though he did not like the person teaching the classes, yelled at the man, threw a chair, and quit. Pt states he has never been hospitalized for SA or for MH reasons. Pt denies any legal involvement or that he has access to weapons. Pt states he he smokes 2-3 blunts of marijuana daily and that the last time he smoked was on 09/16/17. He shares that he was never abused as a child, though he walked in on his father SA his sister; he shares he reported this information to his mother and step-father, which resulted in his father going to prison. Pt shares his maternal grandfather is schizophrenic and has attempted to kill herself. He states his father has anger management issues and that everyone in his family smokes marijuana.  Pt is oriented x4. His recent and remote memory is intact. Pt was cooperative and friendly throughout the assessment. Pt's insight is good; his judgement is fair and impulse control is poor. It was noted to pt by clinician that he has a clean  legal/criminal record and that having difficulties with his anger management could easily ruin that for him; clinician stated pt getting himself engaged in therapy with a therapist he likes working with could be of benefit for him, as he could help pt with coping skills, anger management skills, and offer pt an ally that is unbiased. Pt expressed an understanding and agreed with clinician.   Diagnosis: F31.5, Bipolar I disorder, Current or most recent episode depressed, With psychotic features   Past Medical History:  Past Medical History:  Diagnosis Date  . Bilateral pulmonary contusion 12/05/2016  . Diabetes mellitus without complication [facility])     Past Surgical History:  Procedure Laterality Date  . NO PAST SURGERIES      Family History: History reviewed. No pertinent family history.  Social History:  reports that he has been smoking cigars.  He uses smokeless tobacco. He reports that he has current or past drug history. Drug: Marijuana. He reports that he does not drink alcohol.  Additional Social History:  Alcohol / Drug Use Pain Medications: Please see MAR Prescriptions: Please see MAR Over the Counter: Please see mAR History of alcohol / drug use?: Yes Longest period of sobriety (when/how long): Unknown Substance #1 Name of Substance 1: Marijuana 1 - Age of First Use: 14 1 - Amount (size/oz): 2-3 blunts 1 - Frequency: Daily 1 - Duration: Unknown 1 - Last Use / Amount:  09/16/17  CIWA: CIWA-Ar BP: 139/69 Pulse Rate: 66 COWS:    Allergies: No Known Allergies  Home Medications:  (Not in a hospital admission)  OB/GYN Status:  No LMP for male patient.  General Assessment Data Assessment unable to be completed: Yes Reason for not completing assessment: Multiple assessments Location of Assessment: [facility] ED TTS Assessment: In system Is this a Tele or Face-to-Face Assessment?: Face-to-Face Is this an Initial Assessment or a Re-assessment for this encounter?: Initial  Assessment Marital status: Divorced [place] name: Fry Is patient pregnant?: No Pregnancy Status: No Living Arrangements: Alone Can pt return to current living arrangement?: Yes Admission Status: Voluntary Is patient capable of signing voluntary admission?: Yes Referral Source: Self/Family/Friend Insurance type: None  Medical Screening Exam ([place]) Medical Exam completed: Yes  Crisis Care Plan Living Arrangements: Alone Legal Guardian: Other:(N/A) Name of Psychiatrist: N/A Name of Therapist: N/A  Education Status Is patient currently in school?: No Is the patient employed, unemployed or receiving disability?: Employed  Risk to self with the past 6 months Suicidal Ideation: Yes-Currently Present Has patient been a risk to self within the past 6 months prior to admission? : Yes Suicidal Intent: Yes-Currently Present Has patient had any suicidal intent within the past 6 months prior to admission? : Yes Is patient at risk for suicide?: Yes Suicidal Plan?: Yes-Currently Present Has patient had any suicidal plan within the past 6 months prior to admission? : Yes Specify Current Suicidal Plan: Pt had narrowed down several options Access to Means: Yes Specify Access to Suicidal Means: Pt had been thinking through several options What has been your use of drugs/alcohol within the last 12 months?: Pt admits to daily marijuana use Previous Attempts/Gestures: Yes How many times?: 1 Other Self Harm Risks: SA Triggers for Past Attempts: Spouse contact Intentional Self Injurious Behavior: None Family Suicide History: Yes Recent stressful life event(s): Conflict (Comment)(Pt shares his wife threatened to keep his kids from him) Persecutory voices/beliefs?: No Depression: No Substance abuse history and/or treatment for substance abuse?: Yes Suicide prevention information given to non-admitted patients: Not applicable  Risk to Others within the past 6 months Homicidal  Ideation: Yes-Currently Present Does patient have any lifetime risk of violence toward others beyond the six months prior to admission? : Unknown Thoughts of Harm to Others: Yes-Currently Present Comment - Thoughts of Harm to Others: Pt has thoughts of harming his ex-wife's boyfriend Current Homicidal Intent: Yes-Currently Present Current Homicidal Plan: Yes-Currently Present Describe Current Homicidal Plan: Pt wants to beat up his ex-wife's boyfriend Access to Homicidal Means: No(Pt denies access to weapons) Identified Victim: Pt's ex-wife's boyfriend History of harm to others?: No Assessment of Violence: On admission Violent Behavior Description: Pt shares he has no previous legal/criminal history Does patient have access to weapons?: No(Pt denies) Criminal Charges Pending?: No(Pt denies) Does patient have a court date: No(Pt denies) Is patient on probation?: No(Pt denies)  Psychosis Hallucinations: Auditory, Visual Delusions: None noted  Mental Status Report Appearance/Hygiene: In scrubs, Unremarkable Eye Contact: Good Motor Activity: Unremarkable(Pt is lying in bed) Speech: Logical/coherent Level of Consciousness: Quiet/awake Mood: Worthless, low self-esteem Affect: Appropriate to circumstance Anxiety Level: None Thought Processes: Coherent Judgement: Impaired Orientation: Person, Place, Time, Situation Obsessive Compulsive Thoughts/Behaviors: None  Cognitive Functioning Concentration: Normal Memory: Recent Intact, Remote Intact Is patient IDD: No Is patient DD?: No Insight: Fair Impulse Control: Poor Appetite: Good Have you had any weight changes? : No Change Sleep: No Change Total Hours of Sleep: 7 Vegetative Symptoms: None  ADLScreening [facility] Assessment Services) Patient's cognitive ability adequate to safely complete daily activities?: Yes Patient able to express need for assistance with ADLs?: Yes Independently performs ADLs?: Yes (appropriate for  developmental age)  Prior Inpatient Therapy Prior Inpatient Therapy: No  Prior Outpatient Therapy Prior Outpatient Therapy: No(Pt shares he enrolled himself in anger management but quit) Does patient have an ACCT team?: No Does patient have Intensive In-House Services?  : No Does patient have [facility] services? : No Does patient have P4CC services?: No  ADL Screening (condition at time of admission) Patient's cognitive ability adequate to safely complete daily activities?: Yes Is the patient deaf or have difficulty hearing?: No Does the patient have difficulty seeing, even when wearing glasses/contacts?: No Does the patient have difficulty concentrating, remembering, or making decisions?: No Patient able to express need for assistance with ADLs?: Yes Does the patient have difficulty dressing or bathing?: No Independently performs ADLs?: Yes (appropriate for developmental age) Does the patient have difficulty walking or climbing stairs?: No Weakness of Legs: None Weakness of Arms/Hands: None     Therapy Consults (therapy consults require a physician order) PT Evaluation Needed: No OT Evalulation Needed: No SLP Evaluation Needed: No Abuse/Neglect Assessment (Assessment to be complete while patient is alone) Abuse/Neglect Assessment Can Be Completed: Yes Physical Abuse: Denies Verbal Abuse: Denies Sexual Abuse: Denies(Pt shares he saw his father SA his sister, which was traumatic to him) Exploitation of patient/patient's resources: Denies Self-Neglect: Denies Values / Beliefs Cultural Requests During Hospitalization: None Spiritual Requests During Hospitalization: None Consults Spiritual Care Consult Needed: No Social Work Consult Needed: No Regulatory affairs officer (For Healthcare) Does Patient Have a Medical Advance Directive?: No Would patient like information on creating a medical advance directive?: No - Patient declined       Disposition: Dr. Weber Cooks met with pt and  determined pt meets inpt hospitalization criteria. Disposition Initial Assessment Completed for this Encounter: Yes Patient referred to: Other (Comment)(Placement at [facility] pending medical clearance)  On Site Evaluation by:   Reviewed with Physician:    Dannielle Burn 09/18/2017 5:47 AM

## 2017-09-18 NOTE — ED Notes (Signed)
Hourly rounding reveals patient sleeping in room. No complaints, stable, in no acute distress. Q15 minute rounds and monitoring via [employer] Cam[employer] to continue. 

## 2017-09-18 NOTE — Tx Team (Signed)
Initial Treatment Plan 09/18/2017 2:18 PM Peter JumboLarry D Rushlow Jr. [id number]    PATIENT STRESSORS: Unable to state stressors, but apparent stressors are- low self esteem, conflict with ex-wife and visits with children.      PATIENT STRENGTHS: Ability for insight Capable of independent living Communication skills Motivation for treatment/growth   PATIENT IDENTIFIED PROBLEMS: Suicidal Ideations  Homicidal Ideations   Depression  Unable to state stressors  Non- compliant with medications             DISCHARGE CRITERIA:  Ability to meet basic life and health needs Adequate post-discharge living arrangements Improved stabilization in mood, thinking, and/or behavior Medical problems require only outpatient monitoring  PRELIMINARY DISCHARGE PLAN: Outpatient therapy Return to previous living arrangement Return to previous work or school arrangements  PATIENT/FAMILY INVOLVEMENT: This treatment plan has been presented to and reviewed with the patient, Peter JumboLarry D Wimberly Jr. The patient has been given the opportunity to ask questions and make suggestions.  Peter KrasJoanne  Krishika Bugge, RN 09/18/2017, 2:18 PM

## 2017-09-18 NOTE — BHH Suicide Risk Assessment (Signed)
[facility] Admission Suicide Risk Assessment   Nursing information obtained from:  Patient Demographic factors:  Male, Caucasian, Divorced or widowed Current Mental Status:  Suicidal ideation indicated by patient, Self-harm thoughts, Belief that plan would result in death, Thoughts of violence towards others Loss Factors:  Loss of significant relationship, Financial problems / change in socioeconomic status Historical Factors:  Prior suicide attempts, Impulsivity Risk Reduction Factors:  Employed, Responsible for children under 21 years of age  Total Time spent with patient: 1 hour Principal Problem: Severe major depression, single episode, without psychotic features (HCC) Diagnosis:   Patient Active Problem List   Diagnosis Date Noted  . Severe major depression, single episode, without psychotic features (HCC) [F32.2] 09/17/2017    Priority: High  . Cannabis use disorder, moderate, dependence (HCC) [F12.20] 09/18/2017  . Hypertension [I10] 09/17/2017  . Diabetes mellitus without complication (HCC) [E11.9] 09/17/2017  . Noncompliance [Z91.19] 09/17/2017  . Suicidal ideation [R45.851] 09/17/2017  . Bilateral pulmonary contusion [S27.322A] 12/05/2016  . Intracranial hemorrhage (HCC) [I62.9] 12/02/2016   Subjective Data: suicidal ideation.  Continued Clinical Symptoms:    The "Alcohol Use Disorders Identification Test", Guidelines for Use in Primary Care, Second Edition.  World Science writer [facility]). Score between 0-7:  no or low risk or alcohol related problems. Score between 8-15:  moderate risk of alcohol related problems. Score between 16-19:  high risk of alcohol related problems. Score 20 or above:  warrants further diagnostic evaluation for alcohol dependence and treatment.   CLINICAL FACTORS:   Severe Anxiety and/or Agitation Depression:   Comorbid alcohol abuse/dependence Impulsivity Insomnia Alcohol/Substance Abuse/Dependencies   Musculoskeletal: Strength & Muscle  Tone: within normal limits Gait & Station: normal Patient leans: N/A  Psychiatric Specialty Exam: Physical Exam  Nursing note and vitals reviewed. Psychiatric: His mood appears anxious. His affect is blunt. His speech is delayed. He is slowed and withdrawn. Cognition and memory are normal. He expresses impulsivity. He exhibits a depressed mood. He expresses suicidal ideation. He expresses suicidal plans.    Review of Systems  Neurological: Negative.   Psychiatric/Behavioral: Positive for depression, substance abuse and suicidal ideas. The patient is nervous/anxious and has insomnia.   All other systems reviewed and are negative.   Blood pressure (!) 141/77, pulse 72, temperature 98.7 F (37.1 C), temperature source Oral, resp. rate 20, height 5\' 7"  (1.702 m), weight 90.7 kg (200 lb), SpO2 100 %.Body mass index is 31.32 kg/m.  General Appearance: Casual  Eye Contact:  Minimal  Speech:  Clear and Coherent  Volume:  Decreased  Mood:  Anxious, Depressed and Hopeless  Affect:  Flat  Thought Process:  Goal Directed and Descriptions of Associations: Intact  Orientation:  Full (Time, Place, and Person)  Thought Content:  WDL  Suicidal Thoughts:  Yes.  with intent/plan  Homicidal Thoughts:  No  Memory:  Immediate;   Fair Recent;   Fair Remote;   Fair  Judgement:  Poor  Insight:  Lacking  Psychomotor Activity:  Psychomotor Retardation  Concentration:  Concentration: Fair and Attention Span: Fair  Recall:  [employer] of Knowledge:  Fair  Language:  Fair  Akathisia:  No  Handed:  Right  AIMS (if indicated):     Assets:  Communication Skills Desire for Improvement Housing Physical Health Resilience Social Support Talents/Skills Transportation Vocational/Educational  ADL's:  Intact  Cognition:  WNL  Sleep:         COGNITIVE FEATURES THAT CONTRIBUTE TO RISK:  None    SUICIDE RISK:  Severe:  Frequent, intense, and enduring suicidal ideation, specific plan, no subjective  intent, but some objective markers of intent (i.e., choice of lethal method), the method is accessible, some limited preparatory behavior, evidence of impaired self-control, severe dysphoria/symptomatology, multiple risk factors present, and few if any protective factors, particularly a lack of social support.  PLAN OF CARE: hospital admission, medication management, substance abuse counseling, discharge planning.  Mr. Marcella DubsOdaniel is a 32 year old male with a history of depresion admitted for suicidal ideation in the context of relationship problems.   #Suicidal ideation -patient is able to contract for safety in the hospital  #Mood -start Effexor 150 mg daily -start Seroquel 100 mg nightly  #Insomnia -TRazodone 100 mg PRN  #DM -noncomplinat with treatment -refuses insulin -Metformin 100 mg BID -ADA diet -CBG  #HTN -HCTZ 25 mg daily  #Cannabis abuse -minimizes problems and declines treatment  #Labs -lipid panel, TSH and A1C -EKG  #Disposition -discharge to home -[facility]   I certify that inpatient services furnished can reasonably be expected to improve the patient's condition.   Kristine LineaJolanta Sydelle Sherfield, MD 09/18/2017, 6:17 PM

## 2017-09-18 NOTE — BH Assessment (Signed)
[facility] by Dr. Toni Amendlapacs.  Attending Physician will be Dr. Jennet MaduroPucilowska.   Patient has [facility] RosemontPhyllis.   ER staff is aware of the admission:  Glenda, ER Sectary   Dr. Cyril LoosenKinner, ER MD   Lanora ManisElizabeth, Patient's Nurse   Ivin BootyJoshua, Patient Access.

## 2017-09-18 NOTE — ED Notes (Addendum)
Approached by TTS, Samantha due to communicated threats made by pt. [place] [facility] [place] [employer] [place] contacted, spoke with officer Darcella Gasmanodson, and made aware of threats. Pt has not provided specific name of who threat is being made but RN in BHU made aware to be aware when talking with pt if names are given to call back and report to [facility]. Oncoming charge, Tammy SoursGreg made aware is situation.

## 2017-09-18 NOTE — Progress Notes (Signed)
Inpatient Diabetes Program Recommendations  AACE/ADA: New Consensus Statement on Inpatient Glycemic Control (2015)  Target Ranges:  Prepandial:   less than 140 mg/dL      Peak postprandial:   less than 180 mg/dL (1-2 hours)      Critically ill patients:  140 - 180 mg/dL   Results for Peter Buckley, Peter Buckley. (MRN [id number]) as of 09/18/2017 11:17  Ref. Range 09/17/2017 14:46  Glucose Latest Ref Range: 65 - 99 mg/dL 409311 (H)    Admit with: Depression/ Suicidal  History: DM  Home DM Meds: Metformin 500 mg BID  Current Orders: Metformin 500 mg BID      MD- Please consider placing orders for Novolog Moderate Correction Scale/ SSI (0-15 units) TID AC + HS while patient holding in the ED  (Use Glycemic Control Order set)     --Will follow patient during hospitalization--  Ambrose FinlandJeannine Johnston Nicholaos Schippers RN, MSN, CDE Diabetes Coordinator Inpatient Glycemic Control Team Team Pager: [phone] (8a-5p)
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
## 2017-09-18 NOTE — ED Provider Notes (Signed)
-----------------------------------------   6:18 AM on 09/18/2017 -----------------------------------------   Blood pressure 139/69, pulse 66, temperature 98.1 F (36.7 C), temperature source Oral, resp. rate 16, height 5\' 7"  (1.702 m), weight 99.8 kg (220 lb), SpO2 100 %.  The patient had no acute events since last update.  Calm and cooperative at this time.  Disposition is pending Psychiatry/Behavioral Medicine team recommendations.     Irean HongSung, Sanjay Broadfoot J, MD 09/18/17 [phone]
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
## 2017-09-19 LAB — LIPID PANEL
CHOL/HDL RATIO: 5.5 ratio
Cholesterol: 219 mg/dL — ABNORMAL HIGH (ref 0–200)
HDL: 40 mg/dL — AB (ref >40)
LDL Cholesterol: 146 mg/dL — ABNORMAL HIGH (ref 0–99)
TRIGLYCERIDES: 166 mg/dL — AB (ref <150)
VLDL: 33 mg/dL (ref 0–40)

## 2017-09-19 LAB — GLUCOSE, CAPILLARY: Glucose-Capillary: 283 mg/dL — ABNORMAL HIGH (ref 65–99)

## 2017-09-19 LAB — TSH: TSH: 1.303 u[IU]/mL (ref 0.350–4.500)

## 2017-09-19 LAB — HEMOGLOBIN A1C
Hgb A1c MFr Bld: 10.4 % — ABNORMAL HIGH (ref 4.8–5.6)
MEAN PLASMA GLUCOSE: 251.78 mg/dL

## 2017-09-19 NOTE — Plan of Care (Signed)
Cooperative, pleasant on approach, medication compliant, no behavioral issues noted on shift. In bed appears to be resting in at this time.

## 2017-09-19 NOTE — Progress Notes (Signed)
[facility] MD Progress Note  09/19/2017 3:03 PM Peter Buckley.  MRN:  [id number] Subjective: Patient seen for follow-up.  32 year old man who presented with severe depression with suicidal ideation.  He tells me today that he is feeling much better.  No longer has any suicidal or homicidal thought.  Spoke with his ex-wife on the phone and realizes now that he had made a bigger deal out of the situation than he needed to.  Patient is smiling and upbeat today.  Says that he slept better and is feeling physically better.  Blood sugars are still quite high but not as bad as they were before.  Blood pressure getting under better control. Principal Problem: Severe major depression, single episode, without psychotic features (HCC) Diagnosis:   Patient Active Problem List   Diagnosis Date Noted  . Cannabis use disorder, moderate, dependence (HCC) [F12.20] 09/18/2017  . Hypertension [I10] 09/17/2017  . Diabetes mellitus without complication (HCC) [E11.9] 09/17/2017  . Severe major depression, single episode, without psychotic features (HCC) [F32.2] 09/17/2017  . Noncompliance [Z91.19] 09/17/2017  . Suicidal ideation [R45.851] 09/17/2017  . Bilateral pulmonary contusion [S27.322A] 12/05/2016  . Intracranial hemorrhage (HCC) [I62.9] 12/02/2016   Total Time spent with patient: 30 minutes  Past Psychiatric History: Long-standing history of depression and irritability and anger  Past Medical History:  Past Medical History:  Diagnosis Date  . Anxiety   . Bilateral pulmonary contusion 12/05/2016  . Depression   . Diabetes mellitus without complication (HCC)   . Hypertension     Past Surgical History:  Procedure Laterality Date  . NO PAST SURGERIES     Family History: History reviewed. No pertinent family history. Family Psychiatric  History: None known Social History:  Social History   Substance and Sexual Activity  Alcohol Use Yes  . Alcohol/week: 2.4 oz  . Types: 4 Cans of beer per week   Comment: drinks 1- 2 x a month consists of 3-4 beers     Social History   Substance and Sexual Activity  Drug Use Yes  . Types: Marijuana    Social History   Socioeconomic History  . Marital status: Single    Spouse name: Not on file  . Number of children: Not on file  . Years of education: Not on file  . Highest education level: Not on file  Occupational History  . Not on file  Social Needs  . Financial resource strain: Not on file  . Food insecurity:    Worry: Not on file    Inability: Not on file  . Transportation needs:    Medical: Not on file    Non-medical: Not on file  Tobacco Use  . Smoking status: Former Smoker    Types: Cigars    Last attempt to quit: 07/29/2017    Years since quitting: 0.1  . Smokeless tobacco: Former Neurosurgeon    Quit date: 07/29/2017  Substance and Sexual Activity  . Alcohol use: Yes    Alcohol/week: 2.4 oz    Types: 4 Cans of beer per week    Comment: drinks 1- 2 x a month consists of 3-4 beers  . Drug use: Yes    Types: Marijuana  . Sexual activity: Yes    Birth control/protection: None  Lifestyle  . Physical activity:    Days per week: Not on file    Minutes per session: Not on file  . Stress: Not on file  Relationships  . Social connections:    Talks on  phone: Not on file    Gets together: Not on file    Attends religious service: Not on file    Active member of club or organization: Not on file    Attends meetings of clubs or organizations: Not on file    Relationship status: Not on file  Other Topics Concern  . Not on file  Social History Narrative   ** Merged History Encounter **       Additional Social History:                         Sleep: Fair  Appetite:  Fair  Current Medications: Current Facility-Administered Medications  Medication Dose Route Frequency Provider Last Rate Last Dose  . acetaminophen (TYLENOL) tablet 650 mg  650 mg Oral Q6H PRN Viggo Perko T, MD      . alum & mag hydroxide-simeth  (MAALOX/MYLANTA) 200-200-20 MG/5ML suspension 30 mL  30 mL Oral Q4H PRN Kylene Zamarron T, MD      . hydrochlorothiazide (HYDRODIURIL) tablet 25 mg  25 mg Oral Daily Thaniel Coluccio T, MD   25 mg at 09/19/17 1610  . hydrOXYzine (ATARAX/VISTARIL) tablet 50 mg  50 mg Oral Q6H PRN Jerime Arif T, MD      . magnesium hydroxide (MILK OF MAGNESIA) suspension 30 mL  30 mL Oral Daily PRN Lukah Goswami T, MD      . metFORMIN (GLUCOPHAGE) tablet 1,000 mg  1,000 mg Oral BID WC Pucilowska, Jolanta B, MD   1,000 mg at 09/19/17 0811  . QUEtiapine (SEROQUEL) tablet 100 mg  100 mg Oral QHS Pucilowska, Jolanta B, MD   100 mg at 09/18/17 2136  . traZODone (DESYREL) tablet 100 mg  100 mg Oral QHS PRN Maevyn Riordan T, MD      . venlafaxine XR (EFFEXOR-XR) 24 hr capsule 75 mg  75 mg Oral Q breakfast Pucilowska, Jolanta B, MD   75 mg at 09/19/17 9604    Lab Results:  Results for orders placed or performed during the hospital encounter of 09/18/17 (from the past 48 hour(s))  Glucose, capillary     Status: Abnormal   Collection Time: 09/18/17  4:08 PM  Result Value Ref Range   Glucose-Capillary 266 (H) 65 - 99 mg/dL  Hemoglobin V4U     Status: Abnormal   Collection Time: 09/19/17  7:04 AM  Result Value Ref Range   Hgb A1c MFr Bld 10.4 (H) 4.8 - 5.6 %    Comment: (NOTE) Pre diabetes:          5.7%-6.4% Diabetes:              >6.4% Glycemic control for   <7.0% adults with diabetes    Mean Plasma Glucose 251.78 mg/dL    Comment: Performed at [facility], [address] [place], [place] [address]  Lipid panel     Status: Abnormal   Collection Time: 09/19/17  7:04 AM  Result Value Ref Range   Cholesterol 219 (H) 0 - 200 mg/dL   Triglycerides 147 (H) <150 mg/dL   HDL 40 (L) >82 mg/dL   Total CHOL/HDL Ratio 5.5 RATIO   VLDL 33 0 - 40 mg/dL   LDL Cholesterol 956 (H) 0 - 99 mg/dL    Comment:        Total Cholesterol/HDL:CHD Risk Coronary Heart Disease Risk Table  Men   Women  1/2  Average Risk   3.4   3.3  Average Risk       5.0   4.4  2 X Average Risk   9.6   7.1  3 X Average Risk  23.4   11.0        Use the calculated Patient Ratio above and the CHD Risk Table to determine the patient's CHD Risk.        ATP III CLASSIFICATION (LDL):  <100     mg/dL   Optimal  161-096  mg/dL   Near or Above                    Optimal  130-159  mg/dL   Borderline  045-409  mg/dL   High  >811     mg/dL   Very High Performed at [facility], [address] [place], [place] [address]   TSH     Status: None   Collection Time: 09/19/17  7:04 AM  Result Value Ref Range   TSH 1.303 0.350 - 4.500 uIU/mL    Comment: Performed by a 3rd Generation assay with a functional sensitivity of <=0.01 uIU/mL. Performed at [facility], [address] [place], [place] [address]     Blood Alcohol level:  Lab Results  Component Value Date   [facility] <10 09/17/2017   ETH <5 12/02/2016    Metabolic Disorder Labs: Lab Results  Component Value Date   HGBA1C 10.4 (H) 09/19/2017   MPG 251.78 09/19/2017   No results found for: PROLACTIN Lab Results  Component Value Date   CHOL 219 (H) 09/19/2017   TRIG 166 (H) 09/19/2017   HDL 40 (L) 09/19/2017   CHOLHDL 5.5 09/19/2017   VLDL 33 09/19/2017   LDLCALC 146 (H) 09/19/2017    Physical Findings: AIMS:  , ,  ,  ,    CIWA:    COWS:     Musculoskeletal: Strength & Muscle Tone: within normal limits Gait & Station: normal Patient leans: N/A  Psychiatric Specialty Exam: Physical Exam  Nursing note and vitals reviewed. Constitutional: He appears well-developed and well-nourished.  HENT:  Head: Normocephalic and atraumatic.  Eyes: Pupils are equal, round, and reactive to light. Conjunctivae are normal.  Neck: Normal range of motion.  Cardiovascular: Regular rhythm and normal heart sounds.  Respiratory: Effort normal. No respiratory distress.  GI: Soft.  Musculoskeletal: Normal range of motion.  Neurological: He is  alert.  Skin: Skin is warm and dry.  Psychiatric: He has a normal mood and affect. His speech is normal and behavior is normal. Thought content normal. Cognition and memory are normal. He expresses impulsivity.    Review of Systems  Constitutional: Negative.   HENT: Negative.   Eyes: Negative.   Respiratory: Negative.   Cardiovascular: Negative.   Gastrointestinal: Negative.   Musculoskeletal: Negative.   Skin: Negative.   Neurological: Negative.   Psychiatric/Behavioral: Negative for depression, hallucinations, memory loss, substance abuse and suicidal ideas. The patient is not nervous/anxious and does not have insomnia.     Blood pressure 140/78, pulse 65, temperature 97.8 F (36.6 C), temperature source Oral, resp. rate 18, height 5\' 7"  (1.702 m), weight 90.7 kg (200 lb), SpO2 100 %.Body mass index is 31.32 kg/m.  General Appearance: Casual  Eye Contact:  Good  Speech:  Clear and Coherent  Volume:  Normal  Mood:  Euthymic  Affect:  Congruent  Thought Process:  Goal Directed  Orientation:  Full (Time, Place, and Person)  Thought Content:  Logical  Suicidal Thoughts:  No  Homicidal Thoughts:  No  Memory:  Immediate;   Fair Recent;   Fair Remote;   Fair  Judgement:  Fair  Insight:  Fair  Psychomotor Activity:  Decreased  Concentration:  Concentration: Fair  Recall:  [employer]Fair  Fund of Knowledge:  Fair  Language:  Fair  Akathisia:  No  Handed:  Right  AIMS (if indicated):     Assets:  Desire for Improvement Housing Social Support  ADL's:  Intact  Cognition:  WNL  Sleep:  Number of Hours: 8     Treatment Plan Summary: Daily contact with patient to assess and evaluate symptoms and progress in treatment, Medication management and Plan Patient's mood is improved.  There is probably an element of "flight into health" of this but is good to see that at least he is not psychotic agitated or suicidal.  Blood sugars are a little better although I am concerned that he may  ultimately need more than just the Glucophage.  If he still running this high tomorrow I will probably ask internal medicine to see him.  Continue medicines now as planned.  Continue engagement in group and individual counseling.  Mordecai RasmussenJohn Toron Bowring, MD 09/19/2017, 3:03 PM

## 2017-09-19 NOTE — Plan of Care (Signed)
Patient denies SI/HI/AVH. Patient has no complaints. Patient is compliant with medication. Patient is pleasant and cooperative. Patient's safety is maintained on unit.   Problem: Medication: Goal: Compliance with prescribed medication regimen will improve Outcome: Progressing   Problem: Self-Concept: Goal: Ability to disclose and discuss suicidal ideas will improve Outcome: Progressing   Problem: Safety: Goal: Ability to remain free from injury will improve Outcome: Progressing

## 2017-09-19 NOTE — [facility] Group Notes (Signed)
[facility] Group Notes:  (Nursing/MHT/Case Management/Adjunct)  Date:  09/19/2017  Time:  10:24 PM  Type of Therapy:  Group Therapy  Participation Level:  Active  Participation Quality:  Appropriate  Affect:  Appropriate  Cognitive:  Appropriate  Insight:  Appropriate  Engagement in Group:  Engaged  Modes of Intervention:  Discussion  Summary of Progress/Problems: Group reviewed rules of unit and expectations. Group made introductions and discussed goal for the day. Group addressed barriers that hindered progress of goals. Group discussed coping skills learned while on the unit and how group could utilize when returning to natural setting. Peyton NajjarLarry stated he accomplished his goal today by staying positive. Peyton NajjarLarry stated he needs to stay on his medications so he does not have to come back. Peyton NajjarLarry stated he can see how important he is to his children and does not want to return. Jinger NeighborsKeith D Gustav Knueppel 09/19/2017, 10:24 PM

## 2017-09-19 NOTE — [facility] Group Notes (Signed)
LCSW Group Therapy Note  09/19/2017 1:15pm  Type of Therapy and Topic:  Group Therapy:  Cognitive Distortions  Participation Level:  Active   Description of Group:    Patients in this group will be introduced to the topic of cognitive distortions.  Patients will identify and describe cognitive distortions, describe the feelings these distortions create for them.  Patients will identify one or more situations in their personal life where they have cognitively distorted thinking and will verbalize challenging this cognitive distortion through positive thinking skills.  Patients will practice the skill of using positive affirmations to challenge cognitive distortions using affirmation cards.    Therapeutic Goals:  1. Patient will identify two or more cognitive distortions they have used 2. Patient will identify one or more emotions that stem from use of a cognitive distortion 3. Patient will demonstrate use of a positive affirmation to counter a cognitive distortion through discussion and/or role play. 4. Patient will describe one way cognitive distortions can be detrimental to wellness   Summary of Patient Progress: The patient reported that he feels "a lot better." Patients were introduced to the topic of cognitive distortions. The patient was able to identify and describe cognitive distortions and the feelings these distortions create for him. The patient indicated that his most used unhelpful thinking style is "all or nothing thinking." Patient shared that he was in special education classes and other kids would make fun of him and that impacted his life negatively. Patient identified a situation in his personal life where he has cognitively distorted thinking and verbalized and challenged this cognitive distortion through positive thinking skills. Patient was able to provide support and validation to other group members       Therapeutic Modalities:   Cognitive Behavioral  Therapy Motivational Interviewing   Masaye Gatchalian  CUEBAS-COLON, LCSW 09/19/2017 10:04 AM

## 2017-09-19 NOTE — Plan of Care (Addendum)
Patient is found in common area upon my arrival. Patient is visible and social throughout the evening. Patient attends group. Denies SI/HI/AVH. Reports eating, voiding and sleeping adequately. Denies pain. Cooperative and polite. Compliant with HS medications and staff direction. Q 15 minute checks maintained. [facility] the shift. Patient slept 7.5 hours. No apparent distress. Will endorse care to oncoming shift.  Problem: Coping: Goal: Coping ability will improve Outcome: Progressing   Problem: Medication: Goal: Compliance with prescribed medication regimen will improve Outcome: Progressing   Problem: Self-Concept: Goal: Will verbalize positive feelings about self Outcome: Progressing   Problem: Safety: Goal: Ability to remain free from injury will improve Outcome: Progressing

## 2017-09-20 LAB — GLUCOSE, CAPILLARY
Glucose-Capillary: 190 mg/dL — ABNORMAL HIGH (ref 65–99)
Glucose-Capillary: 217 mg/dL — ABNORMAL HIGH (ref 65–99)

## 2017-09-20 NOTE — Progress Notes (Signed)
D- Patient alert and oriented. Patient presents in a pleasant mood on assessment stating that he slept "good" last night and has not voiced any major complaints to this writer at this time. Patient denies SI, HI, AVH, and pain at this time. Patient also denies any signs/symptoms of depression and anxiety stating to this writer that he's feeling "great, way better". Patient's goal for today is to "keep doing good to get home faster to see my kids".  A- Scheduled medications administered to patient, per MD orders. Support and encouragement provided.  Routine safety checks conducted every 15 minutes.  Patient informed to notify staff with problems or concerns.  R- No adverse drug reactions noted. Patient contracts for safety at this time. Patient compliant with medications and treatment plan. Patient receptive, calm, and cooperative. Patient interacts well with others on the unit.  Patient remains safe at this time.

## 2017-09-20 NOTE — Plan of Care (Signed)
Pt calm and cooperative this evening. Pt inquired about when would he be discharge because he feels he is ready to go home to his children. He upset him tonight when he talk to his kids and they were crying on the phone. Pt denies SI/HI. Pt is receptive to treatment and safety maintained on unit.  Problem: Education: Goal: Ability to make informed decisions regarding treatment will improve Outcome: Progressing   Problem: Coping: Goal: Coping ability will improve Outcome: Progressing   Problem: Health Behavior/Discharge Planning: Goal: Identification of resources available to assist in meeting health care needs will improve Outcome: Progressing   Problem: Medication: Goal: Compliance with prescribed medication regimen will improve Outcome: Progressing   Problem: Education: Goal: Knowledge of [facility] Education information/materials will improve Outcome: Progressing

## 2017-09-20 NOTE — Plan of Care (Signed)
Patient has the ability to make informed decisions regarding his treatment plan as well as the ability to cope by attending unit groups. Patient has the ability to identify the available resources to assist him in meeting his health-care needs. Patient has been in compliance with his prescribed therapeutic regimen and has not voiced any further questions or concerns at this time. Patient denies SI/HI/AVH as well as any signs/symptoms of depression/anxiety. Patient has verbalized positive feelings about himself and states to this writer that his goal for today is to "keep doing good to get home faster to see my kids". Patient has verbalized understanding of the general information that's been provided to him and has not voiced any further questions or concerns at this time. Patient has remained free from injury thus far and remains safe on the unit at this time.  Problem: Education: Goal: Ability to make informed decisions regarding treatment will improve Outcome: Progressing   Problem: Coping: Goal: Coping ability will improve Outcome: Progressing   Problem: Health Behavior/Discharge Planning: Goal: Identification of resources available to assist in meeting health care needs will improve Outcome: Progressing   Problem: Medication: Goal: Compliance with prescribed medication regimen will improve Outcome: Progressing   Problem: Self-Concept: Goal: Ability to disclose and discuss suicidal ideas will improve Outcome: Progressing Goal: Will verbalize positive feelings about self Outcome: Progressing   Problem: Education: Goal: Knowledge of [facility] Education information/materials will improve Outcome: Progressing   Problem: Safety: Goal: Ability to remain free from injury will improve Outcome: Progressing

## 2017-09-20 NOTE — [facility] Group Notes (Signed)
LCSW Group Therapy Note 09/20/2017 1:15pm  Type of Therapy and Topic: Group Therapy: Feelings Around Returning Home & Establishing a Supportive Framework and Supporting Oneself When Supports Not Available  Participation Level: Active  Description of Group:  Patients first processed thoughts and feelings about upcoming discharge. These included fears of upcoming changes, lack of change, new living environments, judgements and expectations from others and overall stigma of mental health issues. The group then discussed the definition of a supportive framework, what that looks and feels like, and how do to discern it from an unhealthy non-supportive network. The group identified different types of supports as well as what to do when your family/friends are less than helpful or unavailable  Therapeutic Goals  1. Patient will identify one healthy supportive network that they can use at discharge. 2. Patient will identify one factor of a supportive framework and how to tell it from an unhealthy network. 3. Patient able to identify one coping skill to use when they do not have positive supports from others. 4. Patient will demonstrate ability to communicate their needs through discussion and/or role plays.  Summary of Patient Progress:  Patient reported he feels "great and cannot wait to see his children." Pt engaged during group session. As patients processed their anxiety about discharge and described healthy supports patient shared "I feel completely different, I was suicidal and depressed first and I don't feel like that anymore, I have energy and I am hopeful about life." Patients identified at least one self-care tool they were willing to use after discharge; spending time with his children, walking, and talking to his mom.   Therapeutic Modalities Cognitive Behavioral Therapy Motivational Interviewing   Peter Buckley  CUEBAS-COLON, LCSW 09/20/2017 11:12 AM

## 2017-09-20 NOTE — Progress Notes (Signed)
[facility] MD Progress Note  09/20/2017 11:07 AM Dirk Dress.  MRN:  [id number] Subjective: Follow-up for 32 year old man with a history of depression.  Patient says his mood remains improved today.  Denies any thoughts of suicide or homicide.  His affect is euthymic and upbeat.  Much less agitated much less overwhelmed and sad than he was when he first came into the hospital.  Blood pressure is under much better control.  Blood sugars are still high with the most recent one being 190.  Patient says he is actually pleased with that he thinks it is the lowest his blood sugar has probably been in years.  He is asking about discharge planning. Principal Problem: Severe major depression, single episode, without psychotic features (HCC) Diagnosis:   Patient Active Problem List   Diagnosis Date Noted  . Cannabis use disorder, moderate, dependence (HCC) [F12.20] 09/18/2017  . Hypertension [I10] 09/17/2017  . Diabetes mellitus without complication (HCC) [E11.9] 09/17/2017  . Severe major depression, single episode, without psychotic features (HCC) [F32.2] 09/17/2017  . Noncompliance [Z91.19] 09/17/2017  . Suicidal ideation [R45.851] 09/17/2017  . Bilateral pulmonary contusion [S27.322A] 12/05/2016  . Intracranial hemorrhage (HCC) [I62.9] 12/02/2016   Total Time spent with patient: 20 minutes  Past Psychiatric History: History of long-standing depression but without having gotten adequate treatment previously  Past Medical History:  Past Medical History:  Diagnosis Date  . Anxiety   . Bilateral pulmonary contusion 12/05/2016  . Depression   . Diabetes mellitus without complication (HCC)   . Hypertension     Past Surgical History:  Procedure Laterality Date  . NO PAST SURGERIES     Family History: History reviewed. No pertinent family history. Family Psychiatric  History: See previous note Social History:  Social History   Substance and Sexual Activity  Alcohol Use Yes  .  Alcohol/week: 2.4 oz  . Types: 4 Cans of beer per week   Comment: drinks 1- 2 x a month consists of 3-4 beers     Social History   Substance and Sexual Activity  Drug Use Yes  . Types: Marijuana    Social History   Socioeconomic History  . Marital status: Single    Spouse name: Not on file  . Number of children: Not on file  . Years of education: Not on file  . Highest education level: Not on file  Occupational History  . Not on file  Social Needs  . Financial resource strain: Not on file  . Food insecurity:    Worry: Not on file    Inability: Not on file  . Transportation needs:    Medical: Not on file    Non-medical: Not on file  Tobacco Use  . Smoking status: Former Smoker    Types: Cigars    Last attempt to quit: 07/29/2017    Years since quitting: 0.1  . Smokeless tobacco: Former Neurosurgeon    Quit date: 07/29/2017  Substance and Sexual Activity  . Alcohol use: Yes    Alcohol/week: 2.4 oz    Types: 4 Cans of beer per week    Comment: drinks 1- 2 x a month consists of 3-4 beers  . Drug use: Yes    Types: Marijuana  . Sexual activity: Yes    Birth control/protection: None  Lifestyle  . Physical activity:    Days per week: Not on file    Minutes per session: Not on file  . Stress: Not on file  Relationships  . Social  connections:    Talks on phone: Not on file    Gets together: Not on file    Attends religious service: Not on file    Active member of club or organization: Not on file    Attends meetings of clubs or organizations: Not on file    Relationship status: Not on file  Other Topics Concern  . Not on file  Social History Narrative   ** Merged History Encounter **       Additional Social History:                         Sleep: Fair  Appetite:  Fair  Current Medications: Current Facility-Administered Medications  Medication Dose Route Frequency Provider Last Rate Last Dose  . acetaminophen (TYLENOL) tablet 650 mg  650 mg Oral Q6H PRN  Clapacs, John T, MD      . alum & mag hydroxide-simeth (MAALOX/MYLANTA) 200-200-20 MG/5ML suspension 30 mL  30 mL Oral Q4H PRN Clapacs, John T, MD      . hydrochlorothiazide (HYDRODIURIL) tablet 25 mg  25 mg Oral Daily Clapacs, John T, MD   25 mg at 09/20/17 0813  . hydrOXYzine (ATARAX/VISTARIL) tablet 50 mg  50 mg Oral Q6H PRN Clapacs, John T, MD      . magnesium hydroxide (MILK OF MAGNESIA) suspension 30 mL  30 mL Oral Daily PRN Clapacs, John T, MD      . metFORMIN (GLUCOPHAGE) tablet 1,000 mg  1,000 mg Oral BID WC Pucilowska, Jolanta B, MD   1,000 mg at 09/20/17 0813  . QUEtiapine (SEROQUEL) tablet 100 mg  100 mg Oral QHS Pucilowska, Jolanta B, MD   100 mg at 09/19/17 2129  . traZODone (DESYREL) tablet 100 mg  100 mg Oral QHS PRN Clapacs, Jackquline Denmark, MD        Lab Results:  Results for orders placed or performed during the hospital encounter of 09/18/17 (from the past 48 hour(s))  Glucose, capillary     Status: Abnormal   Collection Time: 09/18/17  4:08 PM  Result Value Ref Range   Glucose-Capillary 266 (H) 65 - 99 mg/dL  Hemoglobin O9G     Status: Abnormal   Collection Time: 09/19/17  7:04 AM  Result Value Ref Range   Hgb A1c MFr Bld 10.4 (H) 4.8 - 5.6 %    Comment: (NOTE) Pre diabetes:          5.7%-6.4% Diabetes:              >6.4% Glycemic control for   <7.0% adults with diabetes    Mean Plasma Glucose 251.78 mg/dL    Comment: Performed at [facility], [address], [place], [place] [address]  Lipid panel     Status: Abnormal   Collection Time: 09/19/17  7:04 AM  Result Value Ref Range   Cholesterol 219 (H) 0 - 200 mg/dL   Triglycerides 413 (H) <150 mg/dL   HDL 40 (L) >24 mg/dL   Total CHOL/HDL Ratio 5.5 RATIO   VLDL 33 0 - 40 mg/dL   LDL Cholesterol 401 (H) 0 - 99 mg/dL    Comment:        Total Cholesterol/HDL:CHD Risk Coronary Heart Disease Risk Table                     Men   Women  1/2 Average Risk   3.4   3.3  Average Risk  5.0   4.4  2 X Average Risk    9.6   7.1  3 X Average Risk  23.4   11.0        Use the calculated Patient Ratio above and the CHD Risk Table to determine the patient's CHD Risk.        ATP III CLASSIFICATION (LDL):  <100     mg/dL   Optimal  161-096  mg/dL   Near or Above                    Optimal  130-159  mg/dL   Borderline  045-409  mg/dL   High  >811     mg/dL   Very High Performed at [facility], [address] [place], [place] [address]   TSH     Status: None   Collection Time: 09/19/17  7:04 AM  Result Value Ref Range   TSH 1.303 0.350 - 4.500 uIU/mL    Comment: Performed by a 3rd Generation assay with a functional sensitivity of <=0.01 uIU/mL. Performed at [facility], [address] [place], [place] [address]   Glucose, capillary     Status: Abnormal   Collection Time: 09/19/17  6:00 PM  Result Value Ref Range   Glucose-Capillary 283 (H) 65 - 99 mg/dL  Glucose, capillary     Status: Abnormal   Collection Time: 09/20/17  6:21 AM  Result Value Ref Range   Glucose-Capillary 190 (H) 65 - 99 mg/dL    Blood Alcohol level:  Lab Results  Component Value Date   ETH <10 09/17/2017   ETH <5 12/02/2016    Metabolic Disorder Labs: Lab Results  Component Value Date   HGBA1C 10.4 (H) 09/19/2017   MPG 251.78 09/19/2017   No results found for: PROLACTIN Lab Results  Component Value Date   CHOL 219 (H) 09/19/2017   TRIG 166 (H) 09/19/2017   HDL 40 (L) 09/19/2017   CHOLHDL 5.5 09/19/2017   VLDL 33 09/19/2017   LDLCALC 146 (H) 09/19/2017    Physical Findings: AIMS:  , ,  ,  ,    CIWA:    COWS:     Musculoskeletal: Strength & Muscle Tone: within normal limits Gait & Station: normal Patient leans: N/A  Psychiatric Specialty Exam: Physical Exam  Nursing note and vitals reviewed. Constitutional: He appears well-developed and well-nourished.  HENT:  Head: Normocephalic and atraumatic.  Eyes: Pupils are equal, round, and reactive to light. Conjunctivae are normal.   Neck: Normal range of motion.  Cardiovascular: Regular rhythm and normal heart sounds.  Respiratory: Effort normal. No respiratory distress.  GI: Soft.  Musculoskeletal: Normal range of motion.  Neurological: He is alert.  Skin: Skin is warm and dry.  Psychiatric: He has a normal mood and affect. His behavior is normal. Judgment and thought content normal.    Review of Systems  Constitutional: Negative.   HENT: Negative.   Eyes: Negative.   Respiratory: Negative.   Cardiovascular: Negative.   Gastrointestinal: Negative.   Musculoskeletal: Negative.   Skin: Negative.   Neurological: Negative.   Psychiatric/Behavioral: Negative.     Blood pressure 140/88, pulse 67, temperature 97.7 F (36.5 C), temperature source Oral, resp. rate 19, height 5\' 7"  (1.702 m), weight 90.7 kg (200 lb), SpO2 100 %.Body mass index is 31.32 kg/m.  General Appearance: Disheveled  Eye Contact:  Good  Speech:  Slow  Volume:  Decreased  Mood:  Euthymic  Affect:  Congruent  Thought Process:  Goal Directed  Orientation:  Full (Time, Place, and Person)  Thought Content:  Logical  Suicidal Thoughts:  No  Homicidal Thoughts:  No  Memory:  Immediate;   Good Recent;   Fair Remote;   Fair  Judgement:  Fair  Insight:  Fair  Psychomotor Activity:  Decreased  Concentration:  Concentration: Fair  Recall:  [employer]Fair  Fund of Knowledge:  Fair  Language:  Fair  Akathisia:  No  Handed:  Right  AIMS (if indicated):     Assets:  Desire for Improvement Housing Resilience  ADL's:  Intact  Cognition:  WNL  Sleep:  Number of Hours: 7.5     Treatment Plan Summary: Daily contact with patient to assess and evaluate symptoms and progress in treatment, Medication management and Plan Patient seems to have improved and settled down from when he first came in.  Talking about discharge planning.  Encourage patient to continue staying out of his room and attending groups and getting benefit from hospitalization.  I am  going to see if we can have the hospitalist see him about his blood sugars.  No other change to treatment plan for today.  Mordecai RasmussenJohn Clapacs, MD 09/20/2017, 11:07 AM
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
## 2017-09-21 LAB — GLUCOSE, CAPILLARY: Glucose-Capillary: 204 mg/dL — ABNORMAL HIGH (ref 65–99)

## 2017-09-21 MED ORDER — METFORMIN HCL 1000 MG PO TABS: 1000.0000 mg | ORAL_TABLET | Freq: Two times a day (BID) | ORAL | 1 refills | Status: AC

## 2017-09-21 MED ORDER — QUETIAPINE FUMARATE 200 MG PO TABS: 200.0000 mg | ORAL_TABLET | Freq: Every day | ORAL | 1 refills | Status: AC

## 2017-09-21 MED ORDER — HYDROCHLOROTHIAZIDE 25 MG PO TABS: 25.0000 mg | ORAL_TABLET | Freq: Every day | ORAL | 1 refills | Status: AC

## 2017-09-21 MED ORDER — QUETIAPINE FUMARATE 200 MG PO TABS: 200.0000 mg | ORAL_TABLET | Freq: Every day | ORAL | Status: DC

## 2017-09-21 MED ORDER — HYDROXYZINE HCL 50 MG PO TABS: 50.0000 mg | ORAL_TABLET | Freq: Four times a day (QID) | ORAL | 1 refills | Status: DC | PRN

## 2017-09-21 NOTE — [facility] Suicide Risk Assessment (Signed)
[facility] INPATIENT:  Family/Significant Other Suicide Prevention Education  Suicide Prevention Education:  Education Completed; Doristine SectionSandy Ward, pt's mother, at [phone] has been identified by the patient as the family member/significant other with whom the patient will be residing, and identified as the person(s) who will aid the patient in the event of a mental health crisis (suicidal ideations/suicide attempt).  With written consent from the patient, the family member/significant other has been provided the following suicide prevention education, prior to the and/or following the discharge of the patient.  The suicide prevention education provided includes the following:  Suicide risk factors  Suicide prevention and interventions  National Suicide Hotline telephone number  [facility] assessment telephone number  [facility] Emergency Assistance 911  [facility] and/or Residential Mobile Crisis Unit telephone number  Request made of family/significant other to:  Remove weapons (e.g., guns, rifles, knives), all items previously/currently identified as safety concern.    Remove drugs/medications (over-the-counter, prescriptions, illicit drugs), all items previously/currently identified as a safety concern.  The family member/significant other verbalizes understanding of the suicide prevention education information provided.  The family member/significant other agrees to remove the items of safety concern listed above.  Pt's mother reported she does not have any concerns and stated, "he sounds a lot better than he has in two or three years." Pt's mother confirmed pt does not have access to weapons or other means of self harm. CSW will continue to coordinate with pt's mother as needed for updates/discharge planning.   Heidi DachKelsey Khamiya Varin, LCSW 09/21/2017, 9:38 AM
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
## 2017-09-21 NOTE — Progress Notes (Signed)
Patient alert and oriented x 4. Ambulates the unit with steady gait.. Reports that he slept good last night without the use of sleep aid. Appetite is good with hyper energy level. Rates his depression, anxiety and hopelessness a 0 and denies SI/HI/SVH and pain. Patient is scheduled for discharge today. Departed the unit with his personal items, prescriptions and discharge paperwork. Patient declined to wait for 7 day supply of medications from pharmacy. States, "Nah, that's ok I will just go get these filled from the prescriptions I got here." Patient escorted to the first floor lobby by this writer to wait for his mother to pick him up. "My mom can't come in with all of those kids she's watching to get me."

## 2017-09-21 NOTE — Tx Team (Signed)
Interdisciplinary Treatment and Diagnostic Plan Update  09/21/2017  Time of Session: [address] [facility] Center[place]dan[facility] Jr[facility] MRN: [id number]  Principal Diagnosis: Severe major depression, single episode, without psychotic features (HCC)  Secondary Diagnoses: Principal Problem:   Severe major depression, single episode, without psychotic features (HCC) Active Problems:   Hypertension   Diabetes mellitus without complication (HCC)   Suicidal ideation   Cannabis use disorder, moderate, dependence (HCC)   Current Medications:  Current Facility-Administered Medications  Medication Dose Route Frequency Provider Last Rate Last Dose  . acetaminophen (TYLENOL) tablet 650 mg  650 mg Oral Q6H PRN Clapacs, John T, MD      . alum & mag hydroxide-simeth (MAALOX/MYLANTA) 200-200-20 MG/5ML suspension 30 mL  30 mL Oral Q4H PRN Clapacs, John T, MD      . hydrochlorothiazide (HYDRODIURIL) tablet 25 mg  25 mg Oral Daily Clapacs, John T, MD   25 mg at 09/21/17 0813  . hydrOXYzine (ATARAX/VISTARIL) tablet 50 mg  50 mg Oral Q6H PRN Clapacs, John T, MD      . magnesium hydroxide (MILK OF MAGNESIA) suspension 30 mL  30 mL Oral Daily PRN Clapacs, John T, MD      . metFORMIN (GLUCOPHAGE) tablet 1,000 mg  1,000 mg Oral BID WC Pucilowska, Jolanta B, MD   1,000 mg at 09/21/17 0813  . QUEtiapine (SEROQUEL) tablet 200 mg  200 mg Oral QHS Pucilowska, Jolanta B, MD       PTA Medications: Medications Prior to Admission  Medication Sig Dispense Refill Last Dose  . HYDROcodone-acetaminophen (NORCO/VICODIN) 5-325 MG tablet Take 1 tablet by mouth every 6 (six) hours as needed for moderate pain. 20 tablet 0   . ibuprofen (MOTRIN IB) 200 MG tablet Take 3 tablets (600 mg total) by mouth every 6 (six) hours as needed. 20 tablet 0   . levETIRAcetam (KEPPRA) 500 MG tablet Take 1 tablet (500 mg total) by mouth 2 (two) times daily. 12 tablet 0   . metFORMIN (GLUCOPHAGE) 500 MG tablet Take 1 tablet (500 mg total) by mouth 2 (two) times daily  with a meal. 60 tablet 11   . metFORMIN (GLUCOPHAGE) 500 MG tablet Take 1,000 mg by mouth daily with breakfast.   12/01/2016 at Unknown time  . sertraline (ZOLOFT) 100 MG tablet Take 100 mg by mouth daily.   11/30/2016 at Unknown time  . traMADol (ULTRAM) 50 MG tablet Take 1 tablet (50 mg total) by mouth every 6 (six) hours as needed. 12 tablet 0   . [DISCONTINUED] hydrochlorothiazide (HYDRODIURIL) 25 MG tablet Take 1 tablet (25 mg total) by mouth daily. 30 tablet 0     Patient Stressors:    Patient Strengths: Ability for insight Capable of independent living Barrister's clerkCommunication skills Motivation for treatment/growth  Treatment Modalities: Medication Management, Group therapy, Case management,  1 to 1 session with clinician, Psychoeducation, Recreational therapy.   Physician Treatment Plan for Primary Diagnosis: Severe major depression, single episode, without psychotic features (HCC) Long Term Goal(s): Improvement in symptoms so as ready for discharge Improvement in symptoms so as ready for discharge   Short Term Goals: Ability to identify changes in lifestyle to reduce recurrence of condition will improve Ability to verbalize feelings will improve Ability to disclose and discuss suicidal ideas Ability to demonstrate self-control will improve Ability to identify and develop effective coping behaviors will improve Ability to maintain clinical measurements within normal limits will improve Compliance with prescribed medications will improve Ability to identify triggers associated with substance abuse/mental health issues  will improve Ability to identify changes in lifestyle to reduce recurrence of condition will improve Ability to demonstrate self-control will improve Ability to identify triggers associated with substance abuse/mental health issues will improve  Medication Management: Evaluate patient's response, side effects, and tolerance of medication regimen.  Therapeutic Interventions:  1 to 1 sessions, Unit Group sessions and Medication administration.  Evaluation of Outcomes: Adequate for Discharge  Physician Treatment Plan for Secondary Diagnosis: Principal Problem:   Severe major depression, single episode, without psychotic features (HCC) Active Problems:   Hypertension   Diabetes mellitus without complication (HCC)   Suicidal ideation   Cannabis use disorder, moderate, dependence (HCC)  Long Term Goal(s): Improvement in symptoms so as ready for discharge Improvement in symptoms so as ready for discharge   Short Term Goals: Ability to identify changes in lifestyle to reduce recurrence of condition will improve Ability to verbalize feelings will improve Ability to disclose and discuss suicidal ideas Ability to demonstrate self-control will improve Ability to identify and develop effective coping behaviors will improve Ability to maintain clinical measurements within normal limits will improve Compliance with prescribed medications will improve Ability to identify triggers associated with substance abuse/mental health issues will improve Ability to identify changes in lifestyle to reduce recurrence of condition will improve Ability to demonstrate self-control will improve Ability to identify triggers associated with substance abuse/mental health issues will improve     Medication Management: Evaluate patient's response, side effects, and tolerance of medication regimen.  Therapeutic Interventions: 1 to 1 sessions, Unit Group sessions and Medication administration.  Evaluation of Outcomes: Adequate for Discharge   RN Treatment Plan for Primary Diagnosis: Severe major depression, single episode, without psychotic features (HCC) Long Term Goal(s): Knowledge of disease and therapeutic regimen to maintain health will improve  Short Term Goals: Ability to verbalize feelings will improve, Ability to identify and develop effective coping behaviors will improve and  Compliance with prescribed medications will improve  Medication Management: RN will administer medications as ordered by provider, will assess and evaluate patient's response and provide education to patient for prescribed medication. RN will report any adverse and/or side effects to prescribing provider.  Therapeutic Interventions: 1 on 1 counseling sessions, Psychoeducation, Medication administration, Evaluate responses to treatment, Monitor vital signs and CBGs as ordered, Perform/monitor CIWA, COWS, AIMS and Fall Risk screenings as ordered, Perform wound care treatments as ordered.  Evaluation of Outcomes: Adequate for Discharge   LCSW Treatment Plan for Primary Diagnosis: Severe major depression, single episode, without psychotic features (HCC) Long Term Goal(s): Safe transition to appropriate next level of care at discharge, Engage patient in therapeutic group addressing interpersonal concerns.  Short Term Goals: Engage patient in aftercare planning with referrals and resources, Increase emotional regulation and Increase skills for wellness and recovery  Therapeutic Interventions: Assess for all discharge needs, 1 to 1 time with Social worker, Explore available resources and support systems, Assess for adequacy in community support network, Educate family and significant other(s) on suicide prevention, Complete Psychosocial Assessment, Interpersonal group therapy.  Evaluation of Outcomes: Adequate for Discharge   Progress in Treatment: Attending groups: Yes. Participating in groups: Yes. Taking medication as prescribed: Yes. Toleration medication: Yes. Family/Significant other contact made: Yes, individual(s) contacted:  pt's mother Patient understands diagnosis: Yes. Discussing patient identified problems/goals with staff: Yes. Medical problems stabilized or resolved: Yes. Denies suicidal/homicidal ideation: Yes. Issues/concerns per patient self-inventory: No. Other: None at this  time.  New problem(s) identified: No, Describe:  None at this time.   New Short  Term/Long Term Goal(s): Pt will report 0 SI by the time of discharge.   Patient Goals:  Pt reported his goal for treatment is, "to feel better and be more positive, and I really feel like I've accomplished those goals already."   Discharge Plan or Barriers: Pt will be discharged home and will continue tx in the outpatient setting with [facility].   Reason for Continuation of Hospitalization: Medication stabilization  Estimated Length of Stay: discharge today  Attendees: Patient: Peter Buckley 09/21/2017 10:56 AM  Physician: Dr. Jennet Maduro, MD 09/21/2017 10:56 AM  Nursing:  09/21/2017 10:56 AM  RN Care Manager: 09/21/2017 10:56 AM  Social Worker: Heidi Dach, LCSW 09/21/2017 10:56 AM  Recreational Therapist: Garret Reddish, CTRS-LRT 09/21/2017 10:56 AM  Other: Johny Shears, LCSWA 09/21/2017 10:56 AM  Other: Jake Shark, LCSW 09/21/2017 10:56 AM  Other: 09/21/2017 10:56 AM    Scribe for Treatment Team: Heidi Dach, LCSW 09/21/2017 10:56 AM

## 2017-09-21 NOTE — [facility] Suicide Risk Assessment (Signed)
[facility]   Principal Problem: Severe major depression, single episode, without psychotic features [facility](HCC) Discharge Diagnoses:  Patient Active Problem List   Diagnosis Date Noted  . Severe major depression, single episode, without psychotic features (HCC) [F32.2] 09/17/2017    Priority: High  . Cannabis use disorder, moderate, dependence (HCC) [F12.20] 09/18/2017  . Hypertension [I10] 09/17/2017  . Diabetes mellitus without complication (HCC) [E11.9] 09/17/2017  . Noncompliance [Z91.19] 09/17/2017  . Suicidal ideation [R45.851] 09/17/2017  . Bilateral pulmonary contusion [S27.322A] 12/05/2016  . Intracranial hemorrhage (HCC) [I62.9] 12/02/2016    Total Time spent with patient: 20 minutes  Musculoskeletal: Strength & Muscle Tone: within normal limits Gait & Station: normal Patient leans: N/A  Psychiatric Specialty Exam: Review of Systems  Neurological: Negative.   Psychiatric/Behavioral: Positive for substance abuse.  All other systems reviewed and are negative.   Blood pressure 138/86, pulse 82, temperature 98.2 F (36.8 C), temperature source Oral, resp. rate 18, height 5\' 7"  (1.702 m), weight 90.7 kg (200 lb), SpO2 100 %.Body mass index is 31.32 kg/m.  General Appearance: Casual  Eye Contact::  Good  Speech:  Clear and Coherent409  Volume:  Normal  Mood:  Anxious  Affect:  Appropriate  Thought Process:  Goal Directed and Descriptions of Associations: Intact  Orientation:  Full (Time, Place, and Person)  Thought Content:  WDL  Suicidal Thoughts:  No  Homicidal Thoughts:  No  Memory:  Immediate;   Fair Recent;   Fair Remote;   Fair  Judgement:  Impaired  Insight:  Present  Psychomotor Activity:  Normal  Concentration:  Fair  Recall:  FiservFair  Fund of Knowledge:Fair  Language: Fair  Akathisia:  No  Handed:  Right  AIMS (if indicated):     Assets:  Communication Skills Desire for Improvement Housing Physical Health Resilience Social  Support Transportation Vocational/Educational  Sleep:  Number of Hours: 7.3  Cognition: WNL  ADL's:  Intact   Mental Status Per Nursing Assessment::   On Admission:  Suicidal ideation indicated by patient, Self-harm thoughts, Belief that plan would result in death, Thoughts of violence towards others  Demographic Factors:  Male, Divorced or widowed, Caucasian and Living alone  Loss Factors: Loss of significant relationship  Historical Factors: Prior suicide attempts, Family history of mental illness or substance abuse and Impulsivity  Risk Reduction Factors:   Responsible for children under 33 years of age, Sense of responsibility to family and Positive social support  Continued Clinical Symptoms:  Severe Anxiety and/or Agitation Alcohol/Substance Abuse/Dependencies  Cognitive Features That Contribute To Risk:  None    Suicide Risk:  Minimal: No identifiable suicidal ideation.  Patients presenting with no risk factors but with morbid ruminations; may be classified as minimal risk based on the severity of the depressive symptoms    Plan Of Care/Follow-up recommendations:  Activity:  AS TOLERATED Diet:  LOW SODIUM HEART HEALTHY ada DIET Other:  KEEP FOLLOW UP APPOINTMENTS  Kristine LineaJolanta Clark Clowdus, MD 09/21/2017, 9:40 AM

## 2017-09-21 NOTE — Progress Notes (Signed)
Recreation Therapy Notes  Date: 09/21/2017  Time: 9:30 am  Location: Craft room  Behavioral response: Appropriate   Intervention Topic: Stress  Discussion/Intervention: Group content on today was focused on stress. The group defined stress and ways to cope with stress. Participants expressed how they know when they are stresses out and certain triggers that stress them out. Individuals described the different ways they have to cope with stress. The group stated reasons why it is important to cope with stress. Patient explained what good stress is and some examples. The group participated in the intervention "Managing Stress". Individuals were able to identify new ways to cope with stress.   Clinical Observations/Feedback:  Patient came to group and made a positive contribution towards the group topic. Participant listened to what peers and staff had to say about the topic at hand and helped encourage peers. Individual was social with peers and staff while participating in the intervention.  Lura Falor LRT/CTRS         Belva Koziel 09/21/2017 12:49 PM

## 2017-09-21 NOTE — Progress Notes (Signed)
  [facility]  Will you be returning to the same living situation after discharge:  Yes,  returning home At discharge, do you have transportation home?: Yes,  pt's mother Do you have the ability to pay for your medications: Yes,  income  Release of information consent forms completed and in the chart;  Patient's signature needed at discharge.  Patient to Follow up at: Follow-up Information    [facility]. Go on 09/23/2017.   Why:  Please go to your hospital discharge appointment on Wednesday, 09/23/17 at 8:30AM. Thank you. Contact information: [address] [place] [id number] [phone]           Next level of care provider [facility] Link:no  Safety Planning and Suicide Prevention discussed: Yes,  with pt and his mother  Have you used any form of tobacco in the last 30 days? (Cigarettes, Smokeless Tobacco, Cigars, and/or Pipes): No  Has patient been referred to the Quitline?: N/A patient is not a smoker  Patient has been referred for addiction treatment: N/A  Heidi DachKelsey Cloy Cozzens, LCSW 09/21/2017, 9:40 AM

## 2018-06-01 ENCOUNTER — Encounter: Payer: Self-pay | Admitting: Emergency Medicine

## 2018-06-01 ENCOUNTER — Emergency Department: Payer: No Typology Code available for payment source

## 2018-06-01 ENCOUNTER — Emergency Department
Admission: EM | Admit: 2018-06-01 | Discharge: 2018-06-01 | Disposition: A | Discharge status: Home / Self Care | Payer: No Typology Code available for payment source | Attending: Emergency Medicine | Admitting: Emergency Medicine

## 2018-06-01 ENCOUNTER — Other Ambulatory Visit: Payer: Self-pay

## 2018-06-01 DIAGNOSIS — Y92410 Unspecified street and highway as the place of occurrence of the external cause: Secondary | ICD-10-CM | POA: Insufficient documentation

## 2018-06-01 DIAGNOSIS — Z87891 Personal history of nicotine dependence: Secondary | ICD-10-CM | POA: Diagnosis not present

## 2018-06-01 DIAGNOSIS — Z7984 Long term (current) use of oral hypoglycemic drugs: Secondary | ICD-10-CM | POA: Diagnosis not present

## 2018-06-01 DIAGNOSIS — Y999 Unspecified external cause status: Secondary | ICD-10-CM | POA: Diagnosis not present

## 2018-06-01 DIAGNOSIS — I10 Essential (primary) hypertension: Secondary | ICD-10-CM | POA: Diagnosis not present

## 2018-06-01 DIAGNOSIS — S63501A Unspecified sprain of right wrist, initial encounter: Secondary | ICD-10-CM | POA: Insufficient documentation

## 2018-06-01 DIAGNOSIS — S6991XA Unspecified injury of right wrist, hand and finger(s), initial encounter: Secondary | ICD-10-CM | POA: Diagnosis present

## 2018-06-01 DIAGNOSIS — Z79899 Other long term (current) drug therapy: Secondary | ICD-10-CM | POA: Insufficient documentation

## 2018-06-01 DIAGNOSIS — Y9389 Activity, other specified: Secondary | ICD-10-CM | POA: Diagnosis not present

## 2018-06-01 DIAGNOSIS — E119 Type 2 diabetes mellitus without complications: Secondary | ICD-10-CM | POA: Diagnosis not present
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
## 2018-11-19 ENCOUNTER — Other Ambulatory Visit: Payer: Self-pay

## 2018-11-19 ENCOUNTER — Emergency Department: Payer: Self-pay

## 2018-11-19 ENCOUNTER — Emergency Department
Admission: EM | Admit: 2018-11-19 | Discharge: 2018-11-19 | Disposition: A | Discharge status: Home / Self Care | Payer: Self-pay | Attending: Emergency Medicine | Admitting: Emergency Medicine

## 2018-11-19 ENCOUNTER — Encounter: Payer: Self-pay | Admitting: Emergency Medicine

## 2018-11-19 DIAGNOSIS — Z7984 Long term (current) use of oral hypoglycemic drugs: Secondary | ICD-10-CM | POA: Insufficient documentation

## 2018-11-19 DIAGNOSIS — Z79899 Other long term (current) drug therapy: Secondary | ICD-10-CM | POA: Insufficient documentation

## 2018-11-19 DIAGNOSIS — Z87891 Personal history of nicotine dependence: Secondary | ICD-10-CM | POA: Insufficient documentation

## 2018-11-19 DIAGNOSIS — Z20828 Contact with and (suspected) exposure to other viral communicable diseases: Secondary | ICD-10-CM | POA: Insufficient documentation

## 2018-11-19 DIAGNOSIS — E119 Type 2 diabetes mellitus without complications: Secondary | ICD-10-CM | POA: Insufficient documentation

## 2018-11-19 DIAGNOSIS — B349 Viral infection, unspecified: Secondary | ICD-10-CM | POA: Insufficient documentation

## 2018-11-19 DIAGNOSIS — Z20822 Contact with and (suspected) exposure to covid-19: Secondary | ICD-10-CM

## 2018-11-19 DIAGNOSIS — I10 Essential (primary) hypertension: Secondary | ICD-10-CM | POA: Insufficient documentation

## 2018-11-19 MED ORDER — LOPERAMIDE HCL 2 MG PO TABS: 2.0000 mg | ORAL_TABLET | Freq: Four times a day (QID) | ORAL | 0 refills | Status: AC | PRN
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
## 2018-11-19 NOTE — Discharge Instructions (Signed)
Do not return to work until your COVID-19 test has resulted. If negative, stay home until all your symptoms have resolved. If positive, do not work for 2 weeks.   See primary care or return to the ER for symptoms of concern.

## 2018-11-19 NOTE — ED Provider Notes (Signed)
[facility] Emergency Department Provider Note  ____________________________________________  Time seen: Approximately 7:31 PM  I have reviewed the triage vital signs and the nursing notes.   HISTORY  Chief Complaint Generalized Body Aches   HPI Peter DressLarry Dale Overturf Jr. is a 33 y.o. male who presents to the emergency department for treatment and evaluation of body aches, headache, sore throat, cough, and diarrhea. Symptoms started yesterday. He has had COVID-19 exposure.    Past Medical History:  Diagnosis Date  . Anxiety   . Bilateral pulmonary contusion 12/05/2016  . Depression   . Diabetes mellitus without complication (HCC)   . Hypertension     Patient Active Problem List   Diagnosis Date Noted  . Cannabis use disorder, moderate, dependence (HCC) 09/18/2017  . Hypertension 09/17/2017  . Diabetes mellitus without complication (HCC) 09/17/2017  . Severe major depression, single episode, without psychotic features (HCC) 09/17/2017  . Noncompliance 09/17/2017  . Suicidal ideation 09/17/2017  . Bilateral pulmonary contusion 12/05/2016  . Intracranial hemorrhage (HCC) 12/02/2016    Past Surgical History:  Procedure Laterality Date  . NO PAST SURGERIES      Prior to Admission medications   Medication Sig Start Date End Date Taking? Authorizing Provider  hydrochlorothiazide (HYDRODIURIL) 25 MG tablet Take 1 tablet (25 mg total) by mouth daily. 09/21/17   Pucilowska, Braulio ConteJolanta B, MD  loperamide (IMODIUM A-D) 2 MG tablet Take 1 tablet (2 mg total) by mouth 4 (four) times daily as needed for diarrhea or loose stools. 11/19/18   Karista Aispuro, Rulon Eisenmengerari B, FNP  metFORMIN (GLUCOPHAGE) 1000 MG tablet Take 1 tablet (1,000 mg total) by mouth 2 (two) times daily with a meal. 09/21/17   Pucilowska, Jolanta B, MD  QUEtiapine (SEROQUEL) 200 MG tablet Take 1 tablet (200 mg total) by mouth at bedtime. 09/21/17   Pucilowska, Ellin GoodieJolanta B, MD    Allergies Patient has no known  allergies.  No family history on file.  Social History Social History   Tobacco Use  . Smoking status: Former Smoker    Types: Cigars    Quit date: 07/29/2017    Years since quitting: 1.3  . Smokeless tobacco: Former NeurosurgeonUser    Quit date: 07/29/2017  Substance Use Topics  . Alcohol use: Yes    Alcohol/week: 4.0 standard drinks    Types: 4 Cans of beer per week    Comment: drinks 1- 2 x a month consists of 3-4 beers  . Drug use: Yes    Types: Marijuana    Review of Systems Constitutional: Positive for fever/chills. Decreased appetite. ENT: Positive for sore throat. Cardiovascular: Denies chest pain. Respiratory: Negative for shortness of breath. Positive for cough. Negative for wheezing.  Gastrointestinal: Positive for nausea,  no vomiting.  Positive for diarrhea.  Musculoskeletal: Positive for body aches Skin: Negative for rash. Neurological: Positive for headaches ____________________________________________   PHYSICAL EXAM:  VITAL SIGNS: ED Triage Vitals  Enc Vitals Group     BP 11/19/18 1120 (!) 135/92     Pulse Rate 11/19/18 1120 64     Resp 11/19/18 1120 16     Temp 11/19/18 1120 98.6 F (37 C)     Temp Source 11/19/18 1120 Oral     SpO2 11/19/18 1120 95 %     Weight 11/19/18 1121 200 lb (90.7 kg)     Height 11/19/18 1121 5\' 7"  (1.702 m)     Head Circumference --      Peak Flow --  Pain Score 11/19/18 1121 8     Pain Loc --      Pain Edu? --      Excl. in [place]? --     Constitutional: Alert and oriented. Acutely ill appearing and in no acute distress. Eyes: Conjunctivae are normal. Ears: Bilateral TM normal. Nose: No sinus congestion noted; no rhinnorhea. Mouth/Throat: Mucous membranes are moist.  Oropharynx erythematous. Tonsils flat. Uvula midline. Neck: No stridor.  Lymphatic: Bilateral cervical lymphadenopathy. Cardiovascular: Normal rate, regular rhythm. Good peripheral circulation. Respiratory: Respirations are even and unlabored.  No retractions.  Breath sounds clear to auscultation. Gastrointestinal: Soft and nontender.  Musculoskeletal: FROM x 4 extremities.  Neurologic:  Normal speech and language. Skin:  Skin is warm, dry and intact. No rash noted. Psychiatric: Mood and affect are normal. Speech and behavior are normal.  ____________________________________________   LABS (all labs ordered are listed, but only abnormal results are displayed)  Labs Reviewed  SARS CORONAVIRUS 2   ____________________________________________  EKG  Not indicated. ____________________________________________  RADIOLOGY  Chest x-ray negative for acute findings per radiology. ____________________________________________   PROCEDURES  Procedure(s) performed: None  Critical Care performed: No ____________________________________________   INITIAL IMPRESSION / ASSESSMENT AND PLAN / ED COURSE  33 y.o. male presenting to the emergency department for treatment and evaluation of symptoms as described in the HPI.  Chest x-ray is reassuring.  Send out COVID test will be obtained and the patient will be discharged home with a work note.  Quarantine instructions were discussed with the patient and also provided in writing.  Patient is aware that he should follow-up with his primary care provider or return to the emergency department for symptoms that change or worsen or for new concerns.  Peter Buckley Piedad Climes. was evaluated in Emergency Department on 11/19/2018 for the symptoms described in the history of present illness. He was evaluated in the context of the global COVID-19 pandemic, which necessitated consideration that the patient might be at risk for infection with the SARS-CoV-2 virus that causes COVID-19. Institutional protocols and algorithms that pertain to the evaluation of patients at risk for COVID-19 are in a state of rapid change based on information released by regulatory bodies including the CDC and federal and state organizations.  These policies and algorithms were followed during the patient's care in the ED.     Medications - No data to display  ED Discharge Orders         Ordered    loperamide (IMODIUM A-D) 2 MG tablet  4 times daily PRN     11/19/18 1301           Pertinent labs & imaging results that were available during my care of the patient were reviewed by me and considered in my medical decision making (see chart for details).    If controlled substance prescribed during this visit, 12 month history viewed on the [place] prior to issuing an initial prescription for Schedule II or III opiod. ____________________________________________   FINAL CLINICAL IMPRESSION(S) / ED DIAGNOSES  Final diagnoses:  Exposure to Covid-19 Virus  Viral illness    Note:  This document was prepared using Dragon voice recognition software and may include unintentional dictation errors.    Victorino Dike, FNP 11/19/18 1958    Nena Polio, MD 11/19/18 [phone]

## 2018-11-19 NOTE — ED Triage Notes (Addendum)
Pt arrives with concerns over possible having covid-19. Pt reports generalized body aches, headache, sore throat, cough, and diarrhea that started yesterday. Pt appears in NAD. Pt denies nausea and vomiting

## 2018-11-20 LAB — SARS CORONAVIRUS 2 (TAT 6-24 HRS): SARS Coronavirus 2: NEGATIVE

## 2019-12-27 IMAGING — CR CHEST - 2 VIEW
1 series · 2 of 2 positions shown · non-contrast
Comparison: June 09, 2013

CLINICAL DATA: Patient is concerned about having [id number]-AR. Body
aches and cough.

EXAM:
CHEST - 2 VIEW

[Series 1: dg chest 2 view · 0.14mm/px · 2 of 2 slices shown]
[im 1/2]
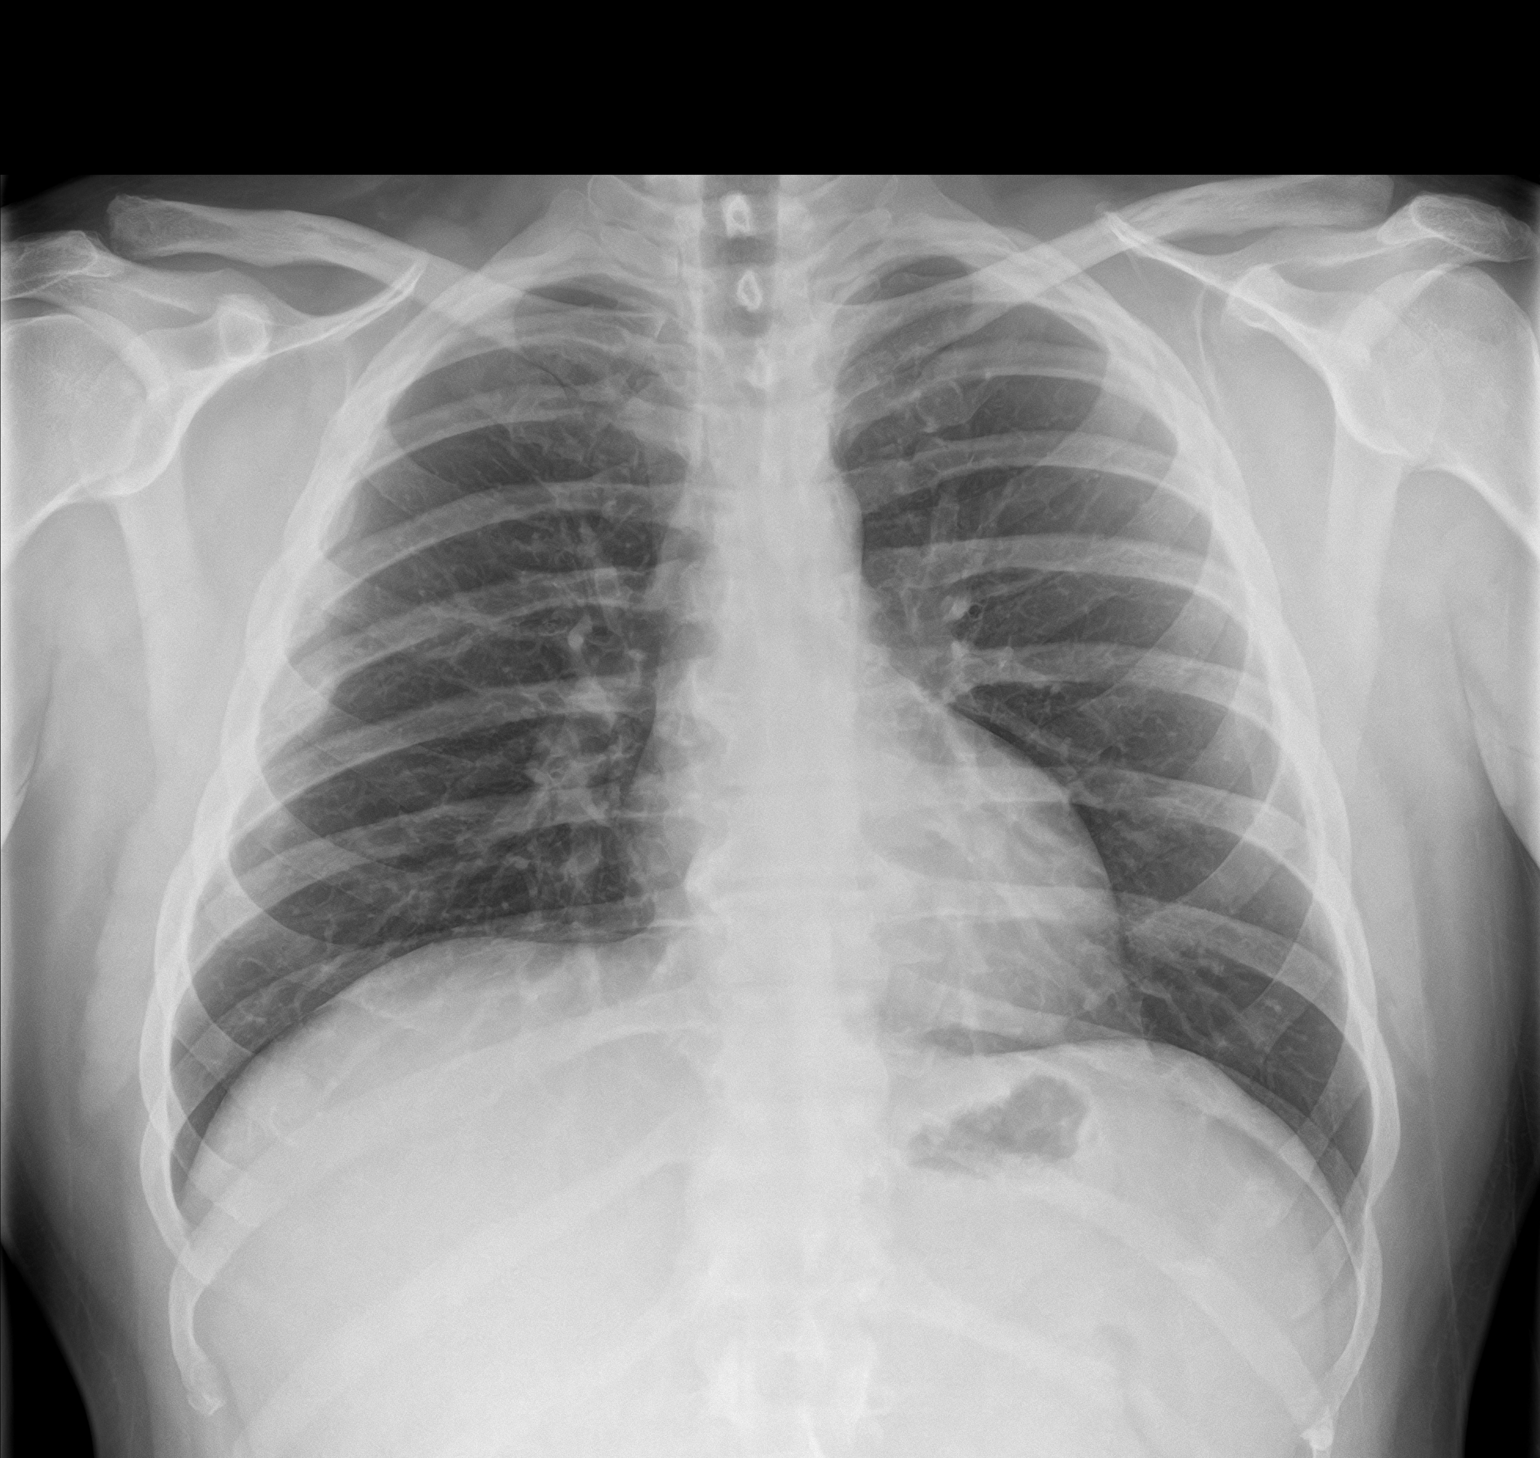
[im 2/2]
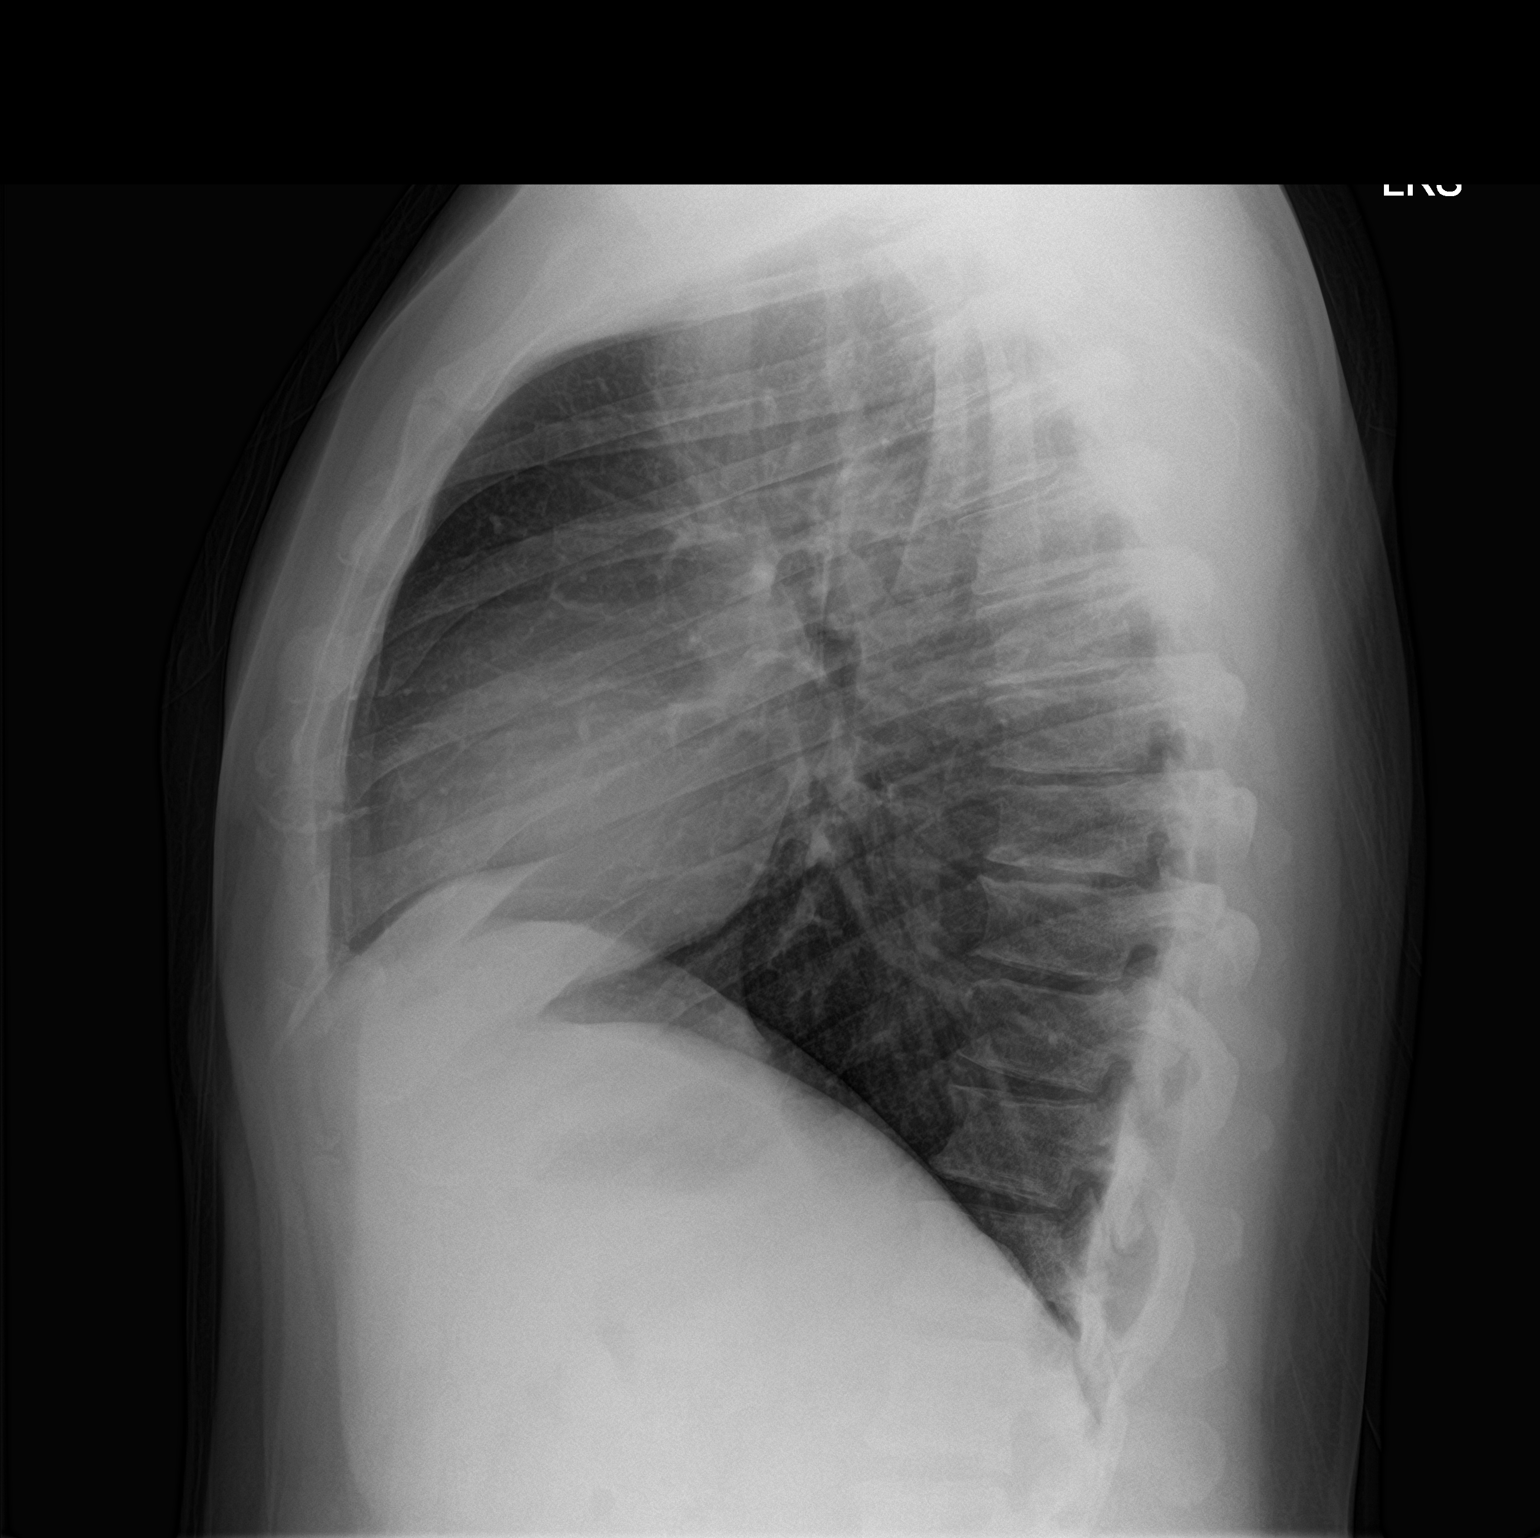

[2 of 2 positions shown; findings below may reference images not displayed]

FINDINGS: There are healed rib fractures on the right, probably with
associated secondary pleural thickening. The pleura is otherwise
normal. The heart, hila, mediastinum, lungs, and pleura otherwise
demonstrate no abnormalities. No focal infiltrates.
IMPRESSION: No cause for the patient's symptoms identified. Healed rib fractures
on the right.

## 2020-05-14 ENCOUNTER — Emergency Department: Payer: No Typology Code available for payment source

## 2020-05-14 ENCOUNTER — Other Ambulatory Visit: Payer: Self-pay

## 2020-05-14 ENCOUNTER — Emergency Department
Admission: EM | Admit: 2020-05-14 | Discharge: 2020-05-14 | Disposition: A | Discharge status: Home / Self Care | Payer: No Typology Code available for payment source | Attending: Emergency Medicine | Admitting: Emergency Medicine

## 2020-05-14 ENCOUNTER — Encounter: Payer: Self-pay | Admitting: Intensive Care

## 2020-05-14 DIAGNOSIS — M545 Low back pain, unspecified: Secondary | ICD-10-CM | POA: Insufficient documentation

## 2020-05-14 DIAGNOSIS — I10 Essential (primary) hypertension: Secondary | ICD-10-CM | POA: Diagnosis not present

## 2020-05-14 DIAGNOSIS — Z7984 Long term (current) use of oral hypoglycemic drugs: Secondary | ICD-10-CM | POA: Insufficient documentation

## 2020-05-14 DIAGNOSIS — Y92410 Unspecified street and highway as the place of occurrence of the external cause: Secondary | ICD-10-CM | POA: Insufficient documentation

## 2020-05-14 DIAGNOSIS — E119 Type 2 diabetes mellitus without complications: Secondary | ICD-10-CM | POA: Insufficient documentation

## 2020-05-14 DIAGNOSIS — Z79899 Other long term (current) drug therapy: Secondary | ICD-10-CM | POA: Insufficient documentation

## 2020-05-14 DIAGNOSIS — M25512 Pain in left shoulder: Secondary | ICD-10-CM | POA: Insufficient documentation

## 2020-05-14 DIAGNOSIS — M7918 Myalgia, other site: Secondary | ICD-10-CM

## 2020-05-14 DIAGNOSIS — Z87891 Personal history of nicotine dependence: Secondary | ICD-10-CM | POA: Diagnosis not present

## 2020-05-14 MED ORDER — ORPHENADRINE CITRATE ER 100 MG PO TB12: 100.0000 mg | ORAL_TABLET | Freq: Two times a day (BID) | ORAL | 0 refills | Status: AC

## 2020-05-14 MED ORDER — NAPROXEN 500 MG PO TABS: 500.0000 mg | ORAL_TABLET | Freq: Two times a day (BID) | ORAL | Status: AC
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
## 2020-05-14 NOTE — Discharge Instructions (Signed)
No acute findings x-ray of the left shoulder and lumbar spine.  Follow discharge care instruction take medication as directed.

## 2021-03-26 ENCOUNTER — Emergency Department
Admission: EM | Admit: 2021-03-26 | Discharge: 2021-03-26 | Discharge status: Against Medical Advice | Payer: No Typology Code available for payment source | Source: Home / Self Care

## 2021-11-10 ENCOUNTER — Encounter: Payer: Self-pay | Admitting: Emergency Medicine

## 2021-11-10 ENCOUNTER — Other Ambulatory Visit: Payer: Self-pay

## 2021-11-10 ENCOUNTER — Emergency Department
Admission: EM | Admit: 2021-11-10 | Discharge: 2021-11-10 | Disposition: A | Discharge status: Home / Self Care | Payer: Self-pay | Attending: Student in an Organized Health Care Education/Training Program | Admitting: Student in an Organized Health Care Education/Training Program

## 2021-11-10 DIAGNOSIS — Z7984 Long term (current) use of oral hypoglycemic drugs: Secondary | ICD-10-CM | POA: Insufficient documentation

## 2021-11-10 DIAGNOSIS — Z87891 Personal history of nicotine dependence: Secondary | ICD-10-CM | POA: Insufficient documentation

## 2021-11-10 DIAGNOSIS — Z79899 Other long term (current) drug therapy: Secondary | ICD-10-CM | POA: Insufficient documentation

## 2021-11-10 DIAGNOSIS — I10 Essential (primary) hypertension: Secondary | ICD-10-CM | POA: Insufficient documentation

## 2021-11-10 DIAGNOSIS — K047 Periapical abscess without sinus: Secondary | ICD-10-CM | POA: Insufficient documentation

## 2021-11-10 DIAGNOSIS — E119 Type 2 diabetes mellitus without complications: Secondary | ICD-10-CM | POA: Insufficient documentation

## 2021-11-10 LAB — CBC WITH DIFFERENTIAL/PLATELET
Abs Immature Granulocytes: 0.03 10*3/uL (ref 0.00–0.07)
Basophils Absolute: 0 10*3/uL (ref 0.0–0.1)
Basophils Relative: 0 %
Eosinophils Absolute: 0.1 10*3/uL (ref 0.0–0.5)
Eosinophils Relative: 1 %
HCT: 42.9 % (ref 39.0–52.0)
Hemoglobin: 15 g/dL (ref 13.0–17.0)
Immature Granulocytes: 0 %
Lymphocytes Relative: 19 %
Lymphs Abs: 1.7 10*3/uL (ref 0.7–4.0)
MCH: 29.1 pg (ref 26.0–34.0)
MCHC: 35 g/dL (ref 30.0–36.0)
MCV: 83.3 fL (ref 80.0–100.0)
Monocytes Absolute: 0.7 10*3/uL (ref 0.1–1.0)
Monocytes Relative: 8 %
Neutro Abs: 6.8 10*3/uL (ref 1.7–7.7)
Neutrophils Relative %: 72 %
Platelets: 265 10*3/uL (ref 150–400)
RBC: 5.15 MIL/uL (ref 4.22–5.81)
RDW: 11.6 % (ref 11.5–15.5)
WBC: 9.4 10*3/uL (ref 4.0–10.5)
nRBC: 0 % (ref 0.0–0.2)

## 2021-11-10 MED ORDER — PENICILLIN V POTASSIUM 500 MG PO TABS
500.0000 mg | ORAL_TABLET | Freq: Once | ORAL | Status: AC
  Administered 2021-11-10: 500 mg via ORAL
  Filled 2021-11-10: qty 1

## 2021-11-10 MED ORDER — PENICILLIN V POTASSIUM 500 MG PO TABS: 500.0000 mg | ORAL_TABLET | Freq: Four times a day (QID) | ORAL | 0 refills | Status: AC

## 2021-11-10 MED ORDER — OXYCODONE-ACETAMINOPHEN 5-325 MG PO TABS: 1.0000 | ORAL_TABLET | ORAL | 0 refills | Status: DC | PRN

## 2021-11-10 NOTE — ED Triage Notes (Signed)
Pt via POV from home. Pt c/o L upper dental pain, states it started a week ago but the pain got worse today. States that he feels like the L side of his face is swollen, no obvious swelling noted. Pt is A&Ox4 and NAD
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
## 2021-11-10 NOTE — Discharge Instructions (Signed)
You have been seen today in the emergency room and diagnosed with a dental abscess. You have been provided with an antibiotic and a short course of pain medication. You should complete the entire course of antibiotics and follow-up with local dentist.

## 2022-08-15 ENCOUNTER — Emergency Department (HOSPITAL_COMMUNITY): Payer: Self-pay

## 2022-08-15 ENCOUNTER — Emergency Department (HOSPITAL_COMMUNITY)
Admission: EM | Admit: 2022-08-15 | Discharge: 2022-08-15 | Disposition: A | Discharge status: Home / Self Care | Payer: Self-pay | Attending: Student | Admitting: Student

## 2022-08-15 ENCOUNTER — Encounter (HOSPITAL_COMMUNITY): Payer: Self-pay

## 2022-08-15 ENCOUNTER — Other Ambulatory Visit: Payer: Self-pay

## 2022-08-15 DIAGNOSIS — Z87891 Personal history of nicotine dependence: Secondary | ICD-10-CM | POA: Insufficient documentation

## 2022-08-15 DIAGNOSIS — Y92410 Unspecified street and highway as the place of occurrence of the external cause: Secondary | ICD-10-CM | POA: Diagnosis not present

## 2022-08-15 DIAGNOSIS — M546 Pain in thoracic spine: Secondary | ICD-10-CM | POA: Diagnosis not present

## 2022-08-15 DIAGNOSIS — I10 Essential (primary) hypertension: Secondary | ICD-10-CM | POA: Diagnosis not present

## 2022-08-15 DIAGNOSIS — Z79899 Other long term (current) drug therapy: Secondary | ICD-10-CM | POA: Insufficient documentation

## 2022-08-15 DIAGNOSIS — Z7984 Long term (current) use of oral hypoglycemic drugs: Secondary | ICD-10-CM | POA: Insufficient documentation

## 2022-08-15 DIAGNOSIS — M545 Low back pain, unspecified: Secondary | ICD-10-CM | POA: Diagnosis present

## 2022-08-15 DIAGNOSIS — E119 Type 2 diabetes mellitus without complications: Secondary | ICD-10-CM | POA: Insufficient documentation

## 2022-08-15 MED ORDER — OXYCODONE-ACETAMINOPHEN 5-325 MG PO TABS: 1.0000 | ORAL_TABLET | Freq: Four times a day (QID) | ORAL | 0 refills | Status: AC | PRN

## 2022-08-15 MED ORDER — KETOROLAC TROMETHAMINE 15 MG/ML IJ SOLN
15.0000 mg | Freq: Once | INTRAMUSCULAR | Status: AC
  Administered 2022-08-15: 15 mg via INTRAMUSCULAR
  Filled 2022-08-15: qty 1

## 2022-08-15 NOTE — Discharge Instructions (Signed)
Follow-up with your family doctor next week for recheck.  Take Tylenol or Motrin for pain and if that does not help you can take the pain medicine as prescribed
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
## 2022-08-15 NOTE — ED Notes (Signed)
C-collar removed by provider and pt walked to bathroom with provider.

## 2022-08-15 NOTE — ED Notes (Signed)
Patient transported to CT 

## 2023-08-05 ENCOUNTER — Other Ambulatory Visit: Payer: Self-pay

## 2023-08-05 ENCOUNTER — Encounter: Payer: Self-pay | Admitting: Emergency Medicine

## 2023-08-05 ENCOUNTER — Emergency Department
Admission: EM | Admit: 2023-08-05 | Discharge: 2023-08-05 | Disposition: A | Discharge status: Home / Self Care | Payer: Self-pay | Attending: Emergency Medicine | Admitting: Emergency Medicine

## 2023-08-05 ENCOUNTER — Emergency Department: Payer: Self-pay

## 2023-08-05 DIAGNOSIS — W6199XA Other contact with other birds, initial encounter: Secondary | ICD-10-CM | POA: Insufficient documentation

## 2023-08-05 DIAGNOSIS — M25441 Effusion, right hand: Secondary | ICD-10-CM | POA: Insufficient documentation

## 2023-08-05 DIAGNOSIS — Z23 Encounter for immunization: Secondary | ICD-10-CM | POA: Insufficient documentation

## 2023-08-05 DIAGNOSIS — S6991XA Unspecified injury of right wrist, hand and finger(s), initial encounter: Secondary | ICD-10-CM | POA: Insufficient documentation

## 2023-08-05 DIAGNOSIS — E119 Type 2 diabetes mellitus without complications: Secondary | ICD-10-CM | POA: Insufficient documentation

## 2023-08-05 MED ORDER — TETANUS-DIPHTH-ACELL PERTUSSIS 5-2.5-18.5 LF-MCG/0.5 IM SUSY
0.5000 mL | PREFILLED_SYRINGE | Freq: Once | INTRAMUSCULAR | Status: AC
  Administered 2023-08-05: 0.5 mL via INTRAMUSCULAR
  Filled 2023-08-05: qty 0.5

## 2023-08-05 MED ORDER — DOXYCYCLINE HYCLATE 100 MG PO TABS: 100.0000 mg | ORAL_TABLET | Freq: Two times a day (BID) | ORAL | 0 refills | Status: AC

## 2023-08-05 MED ORDER — AMOXICILLIN-POT CLAVULANATE 875-125 MG PO TABS: 1.0000 | ORAL_TABLET | Freq: Two times a day (BID) | ORAL | 0 refills | Status: AC

## 2023-08-05 MED ORDER — OXYCODONE HCL 5 MG PO TABS: 5.0000 mg | ORAL_TABLET | Freq: Four times a day (QID) | ORAL | 0 refills | Status: AC | PRN

## 2023-08-05 NOTE — Discharge Instructions (Addendum)
 Two antibiotics to help prevent infection. Take ibuprofen  600 every 8 hours for one week.  Take oxycodone  for break through pain. Do not drive or work on it  Return to the ER if you develop redness, fevers, increasing swelling up the finger or any other concerns.  This can sometimes develop into abscesses and these can be more emergent.  Take oxycodone  as prescribed. Do not drink alcohol, drive or participate in any other potentially dangerous activities while taking this medication as it may make you sleepy. Do not take this medication with any other sedating medications, either prescription or over-the-counter. If you were prescribed Percocet or Vicodin, do not take these with acetaminophen  (Tylenol ) as it is already contained within these medications.  This medication is an opiate (or narcotic) pain medication and can be habit forming. Use it as little as possible to achieve adequate pain control. Do not use or use it with extreme caution if you have a history of opiate abuse or dependence. If you are on a pain contract with your primary care doctor or a pain specialist, be sure to let them know you were prescribed this medication today from the Emergency Department. This medication is intended for your use only - do not give any to anyone else and keep it in a secure place where nobody else, especially children, have access to it.

## 2023-08-05 NOTE — ED Triage Notes (Signed)
 Pt to ED via POV. Pt states that yesterday his rooster got him with his spurs. Pt states that his morning he has swelling in his hand and he is unable to move his right index finger. Pts right hand is swollen. Pt denies fevers. Pt is in NAD.

## 2023-08-05 NOTE — ED Provider Notes (Signed)
 [facility] Provider Note    Event Date/Time   First MD Initiated Contact with Patient 08/05/23 [phone]     (approximate)   History   Animal Bite   HPI  Peter Buckley. is a 38 y.o. male w/ history of diabetes, HTN who comes inwho comes in after injury to his hand from a rooster.  Patient reportedly got a new rooster and the spur on the back of the roosters feet hit his right hand in multiple places.  He reports being okay yesterday other than some mild pain but then woke up with some increased swelling and some difficulty moving his fourth digit secondary to some swelling.  He reports that it hit him on the posterior aspect of the digit.  Physical Exam   Triage Vital Signs: ED Triage Vitals  Encounter Vitals Group     BP 08/05/23 0943 (!) 151/92     Systolic BP Percentile --      Diastolic BP Percentile --      Pulse Rate 08/05/23 0943 85     Resp 08/05/23 0943 16     Temp 08/05/23 0943 98.3 F (36.8 C)     Temp Source 08/05/23 0943 Oral     SpO2 08/05/23 0943 100 %     Weight 08/05/23 0944 194 lb 0.1 oz (88 kg)     Height 08/05/23 0944 5\' 7"  (1.702 m)     Head Circumference --      Peak Flow --      Pain Score 08/05/23 0943 3     Pain Loc --      Pain Education --      Exclude from Growth Chart --     Most recent vital signs: Vitals:   08/05/23 0943  BP: (!) 151/92  Pulse: 85  Resp: 16  Temp: 98.3 F (36.8 C)  SpO2: 100%     General: Awake, no distress.  CV:  Good peripheral perfusion.  Resp:  Normal effort.  Abd:  No distention.  Other:  Scattered scratch marks noted with swelling of the hand- no redness no warmth. Some pain with moving R index finger but able to flex it almost fully. No tenderness on the flexor tendon, no fusiform swelling. Scratch mark where he is tender is on the dorsum of hand not the palmar aspect.  Good pulse.    ED Results / Procedures / Treatments   Labs (all labs ordered are listed, but only  abnormal results are displayed) Labs Reviewed - No data to display    RADIOLOGY I have reviewed the xray personally and interpreted no evidence of any fractures   PROCEDURES:  Critical Care performed: No  Procedures   MEDICATIONS ORDERED IN ED: Medications - No data to display   IMPRESSION / MDM / ASSESSMENT AND PLAN / ED COURSE  I reviewed the triage vital signs and the nursing notes.   Patient's presentation is most consistent with acute presentation with potential threat to life or bodily function.   Patient comes in with pain and swelling of the right hand after an injury with a rooster yesterday.  X-ray ordered to evaluate for any fractures.  He is got some swelling noted but no significant redness or erythema. Kanavel sign negative  doubt flexor tenosynovitis. Doubt abscess given just occurred yesterday no warmth or rendess.   D/w pharmacy recommend doxy for bird coverage.  They also said we could add on Augmentin out of precaution.  Given this is from a burden no need for rabies vaccination.  Discussed with patient he is a diabetic.  We discussed checking his blood sugar today to see if he would need any interventions with such as fluids.  Patient declined stating that he actually has to go pick up his insulin  today from the pharmacy.  He denies any symptoms of DKA.  He understands that infections can make his sugars go up higher and is important to have well-controlled sugars in order for his hand to heal.  But at this time he declines any blood work and states that he would prefer to go home.  We considered admission given this involves his hand but other than the swelling he really does not have any redness or warmth I suspect his limited mobility is related more to the swelling.  His injury is on the dorsum of the hand and the flexor tendon itself appears to be unaffected.  He is able to flex the finger and fully extend the finger.  Patient preferred outpatient trial of oral  antibiotics and will return if symptoms are worsening.   The patient is on the cardiac monitor to evaluate for evidence of arrhythmia and/or significant heart rate changes.      FINAL CLINICAL IMPRESSION(S) / ED DIAGNOSES   Final diagnoses:  Contact with bird as cause of accidental injury     Rx / DC Orders   ED Discharge Orders          Ordered    oxyCODONE  (ROXICODONE ) 5 MG immediate release tablet  Every 6 hours PRN        08/05/23 1047    doxycycline (VIBRA-TABS) 100 MG tablet  2 times daily        08/05/23 1047    amoxicillin -clavulanate (AUGMENTIN) 875-125 MG tablet  2 times daily        08/05/23 1047             Note:  This document was prepared using Dragon voice recognition software and may include unintentional dictation errors.   Lubertha Rush, MD 08/05/23 1056

## 2023-09-22 ENCOUNTER — Other Ambulatory Visit: Payer: Self-pay

## 2023-09-22 ENCOUNTER — Emergency Department
Admission: EM | Admit: 2023-09-22 | Discharge: 2023-09-22 | Disposition: A | Discharge status: Home / Self Care | Payer: Self-pay | Attending: Emergency Medicine | Admitting: Emergency Medicine

## 2023-09-22 ENCOUNTER — Emergency Department: Payer: Self-pay

## 2023-09-22 DIAGNOSIS — R109 Unspecified abdominal pain: Secondary | ICD-10-CM

## 2023-09-22 DIAGNOSIS — E119 Type 2 diabetes mellitus without complications: Secondary | ICD-10-CM | POA: Insufficient documentation

## 2023-09-22 DIAGNOSIS — I10 Essential (primary) hypertension: Secondary | ICD-10-CM | POA: Insufficient documentation

## 2023-09-22 DIAGNOSIS — R1030 Lower abdominal pain, unspecified: Secondary | ICD-10-CM | POA: Insufficient documentation

## 2023-09-22 LAB — COMPREHENSIVE METABOLIC PANEL WITH GFR
ALT: 38 U/L (ref 0–44)
AST: 24 U/L (ref 15–41)
Albumin: 4.2 g/dL (ref 3.5–5.0)
Alkaline Phosphatase: 62 U/L (ref 38–126)
Anion gap: 10 (ref 5–15)
BUN: 20 mg/dL (ref 6–20)
CO2: 24 mmol/L (ref 22–32)
Calcium: 9.2 mg/dL (ref 8.9–10.3)
Chloride: 102 mmol/L (ref 98–111)
Creatinine, Ser: 1.16 mg/dL (ref 0.61–1.24)
GFR, Estimated: >60 mL/min (ref >60)
Glucose, Bld: 182 mg/dL — ABNORMAL HIGH (ref 70–99)
Potassium: 4 mmol/L (ref 3.5–5.1)
Sodium: 136 mmol/L (ref 135–145)
Total Bilirubin: 0.7 mg/dL (ref 0.0–1.2)
Total Protein: 7.4 g/dL (ref 6.5–8.1)

## 2023-09-22 LAB — LIPASE, BLOOD: Lipase: 32 U/L (ref 11–51)

## 2023-09-22 LAB — URINALYSIS, ROUTINE W REFLEX MICROSCOPIC
Bilirubin Urine: NEGATIVE
Glucose, UA: 50 mg/dL — AB
Hgb urine dipstick: NEGATIVE
Ketones, ur: NEGATIVE mg/dL
Leukocytes,Ua: NEGATIVE
Nitrite: NEGATIVE
Protein, ur: 100 mg/dL — AB
Specific Gravity, Urine: 1.035 — ABNORMAL HIGH (ref 1.005–1.030)
pH: 5 (ref 5.0–8.0)

## 2023-09-22 LAB — CBC
HCT: 50.9 % (ref 39.0–52.0)
Hemoglobin: 17.5 g/dL — ABNORMAL HIGH (ref 13.0–17.0)
MCH: 28.9 pg (ref 26.0–34.0)
MCHC: 34.4 g/dL (ref 30.0–36.0)
MCV: 84 fL (ref 80.0–100.0)
Platelets: 263 10*3/uL (ref 150–400)
RBC: 6.06 MIL/uL — ABNORMAL HIGH (ref 4.22–5.81)
RDW: 12.4 % (ref 11.5–15.5)
WBC: 7.8 10*3/uL (ref 4.0–10.5)
nRBC: 0 % (ref 0.0–0.2)

## 2023-09-22 MED ORDER — DICYCLOMINE HCL 10 MG PO CAPS: 10.0000 mg | ORAL_CAPSULE | Freq: Three times a day (TID) | ORAL | 0 refills | Status: AC | PRN

## 2023-09-22 MED ORDER — ONDANSETRON HCL 4 MG/2ML IJ SOLN
4.0000 mg | Freq: Once | INTRAMUSCULAR | Status: AC
  Administered 2023-09-22: 4 mg via INTRAVENOUS
  Filled 2023-09-22: qty 2

## 2023-09-22 MED ORDER — SODIUM CHLORIDE 0.9 % IV BOLUS
1000.0000 mL | Freq: Once | INTRAVENOUS | Status: AC
  Administered 2023-09-22: 1000 mL via INTRAVENOUS

## 2023-09-22 MED ORDER — MORPHINE SULFATE (PF) 4 MG/ML IV SOLN
4.0000 mg | Freq: Once | INTRAVENOUS | Status: AC
  Administered 2023-09-22: 4 mg via INTRAVENOUS
  Filled 2023-09-22: qty 1

## 2023-09-22 MED ORDER — IOHEXOL 300 MG/ML  SOLN
100.0000 mL | Freq: Once | INTRAMUSCULAR | Status: AC | PRN
  Administered 2023-09-22: 100 mL via INTRAVENOUS

## 2023-09-22 NOTE — ED Notes (Signed)
 Pt transported to CT ?

## 2023-09-22 NOTE — ED Triage Notes (Signed)
 Pt to ED for lower abdominal pain around bladder area since this morning. Pain radiates into penis but not testicles. Denies dysuria, NVD and flank pain. Has appendix and GB.

## 2023-09-22 NOTE — ED Provider Notes (Signed)
 [facility] Provider Note    Event Date/Time   First MD Initiated Contact with Patient 09/22/23 [phone]     (approximate)  History   Chief Complaint: Abdominal Pain  HPI  Peter Buckley. is a 38 y.o. male with a past medical history of anxiety, diabetes, hypertension, presents to the emergency department for lower abdominal pain.  According to the patient since around 5:00 this morning he has been experiencing pain across the lower abdomen along with diarrhea.  Patient states a slight discomfort when urinating as well.  Patient has had a kidney stone in the past but states this does not feel like a kidney stone.  Denies any nausea or vomiting.  No fever.  Physical Exam   Triage Vital Signs: ED Triage Vitals  Encounter Vitals Group     BP 09/22/23 0744 (!) 160/108     Girls Systolic BP Percentile --      Girls Diastolic BP Percentile --      Boys Systolic BP Percentile --      Boys Diastolic BP Percentile --      Pulse Rate 09/22/23 0744 86     Resp 09/22/23 0744 16     Temp 09/22/23 0744 97.8 F (36.6 C)     Temp Source 09/22/23 0744 Oral     SpO2 09/22/23 0744 97 %     Weight 09/22/23 0750 230 lb (104.3 kg)     Height 09/22/23 0750 5' 7 (1.702 m)     Head Circumference --      Peak Flow --      Pain Score 09/22/23 0748 7     Pain Loc --      Pain Education --      Exclude from Growth Chart --     Most recent vital signs: Vitals:   09/22/23 0744  BP: (!) 160/108  Pulse: 86  Resp: 16  Temp: 97.8 F (36.6 C)  SpO2: 97%    General: Awake, no distress.  CV:  Good peripheral perfusion.  Regular rate and rhythm  Resp:  Normal effort.  Equal breath sounds bilaterally.  Abd:  No distention.  Soft, nontender.  No rebound or guarding.  ED Results / Procedures / Treatments   RADIOLOGY  I have reviewed interpret the CT images.  No obvious obstruction or significant abnormalities in my evaluation. Radiology is read the CT scan as  negative   MEDICATIONS ORDERED IN ED: Medications  morphine  (PF) 4 MG/ML injection 4 mg (has no administration in time range)  ondansetron  (ZOFRAN ) injection 4 mg (has no administration in time range)  sodium chloride  0.9 % bolus 1,000 mL (has no administration in time range)     IMPRESSION / MDM / ASSESSMENT AND PLAN / ED COURSE  I reviewed the triage vital signs and the nursing notes.  Patient's presentation is most consistent with acute presentation with potential threat to life or bodily function.  Patient presents to the emergency department for lower abdominal pain diarrhea.  Differential is quite broad would include colitis, diverticulitis, UTI, pyelonephritis, ureterolithiasis.  We will check labs, urinalysis will treat pain and IV hydrate while awaiting results.  If the patient's urinalysis shows a significant amount of blood we will likely proceed with a CT renal scan, if the urinalysis does not show significant amount of blood we will proceed with a CT scan with contrast of the abdomen and pelvis to further evaluate.  Patient's workup today shows a reassuring  CBC with a normal white blood cell count, reassuring chemistry.  Normal LFTs and lipase.  Urinalysis shows no concerning findings.  CT scan does not appear to show any significant abnormality.  Will send a urine culture as a precaution.  Will have the patient follow-up with his PCP.  Given the lower abdominal discomfort and diarrhea this morning could very likely be more intestinal pain.  Will discharge with Bentyl.  FINAL CLINICAL IMPRESSION(S) / ED DIAGNOSES   Abdominal pain   Note:  This document was prepared using Dragon voice recognition software and may include unintentional dictation errors.   Dorothyann Drivers, MD 09/22/23 1038

## 2023-09-23 LAB — URINE CULTURE: Culture: NO GROWTH
